# Patient Record
Sex: Male | Born: 1985 | Race: Black or African American | Hispanic: No | Marital: Single | Smoking: Former smoker
Health system: Southern US, Community
[De-identification: ages and names within clinical notes are randomized; demographics above are authoritative.]

## PROBLEM LIST (undated history)

## (undated) DIAGNOSIS — K279 Peptic ulcer, site unspecified, unspecified as acute or chronic, without hemorrhage or perforation: Secondary | ICD-10-CM

## (undated) DIAGNOSIS — R109 Unspecified abdominal pain: Secondary | ICD-10-CM

---
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
## 2004-02-11 ENCOUNTER — Emergency Department (HOSPITAL_COMMUNITY): Admission: EM | Admit: 2004-02-11 | Discharge: 2004-02-11 | Payer: Self-pay | Admitting: Emergency Medicine

## 2006-05-19 ENCOUNTER — Emergency Department (HOSPITAL_COMMUNITY): Admission: EM | Admit: 2006-05-19 | Discharge: 2006-05-19 | Payer: Self-pay | Admitting: Emergency Medicine

## 2007-04-19 ENCOUNTER — Emergency Department (HOSPITAL_COMMUNITY): Admission: EM | Admit: 2007-04-19 | Discharge: 2007-04-19 | Payer: Self-pay | Admitting: Family Medicine

## 2007-08-10 ENCOUNTER — Emergency Department (HOSPITAL_COMMUNITY): Admission: EM | Admit: 2007-08-10 | Discharge: 2007-08-10 | Payer: Self-pay | Admitting: Emergency Medicine

## 2007-10-02 ENCOUNTER — Emergency Department (HOSPITAL_COMMUNITY): Admission: EM | Admit: 2007-10-02 | Discharge: 2007-10-02 | Payer: Self-pay | Admitting: Emergency Medicine

## 2008-07-16 ENCOUNTER — Emergency Department (HOSPITAL_COMMUNITY): Admission: EM | Admit: 2008-07-16 | Discharge: 2008-07-16 | Payer: Self-pay | Admitting: Emergency Medicine

## 2008-07-23 ENCOUNTER — Emergency Department (HOSPITAL_COMMUNITY): Admission: EM | Admit: 2008-07-23 | Discharge: 2008-07-23 | Payer: Self-pay | Admitting: Emergency Medicine

## 2008-11-17 ENCOUNTER — Emergency Department (HOSPITAL_COMMUNITY): Admission: EM | Admit: 2008-11-17 | Discharge: 2008-11-17 | Payer: Self-pay | Admitting: Emergency Medicine

## 2008-11-20 ENCOUNTER — Inpatient Hospital Stay (HOSPITAL_COMMUNITY): Admission: EM | Admit: 2008-11-20 | Discharge: 2008-11-23 | Payer: Self-pay | Admitting: Emergency Medicine

## 2008-12-24 ENCOUNTER — Emergency Department (HOSPITAL_COMMUNITY): Admission: EM | Admit: 2008-12-24 | Discharge: 2008-12-24 | Payer: Self-pay | Admitting: Emergency Medicine

## 2009-03-12 ENCOUNTER — Emergency Department (HOSPITAL_COMMUNITY): Admission: EM | Admit: 2009-03-12 | Discharge: 2009-03-12 | Payer: Self-pay | Admitting: Emergency Medicine

## 2009-05-26 ENCOUNTER — Emergency Department (HOSPITAL_COMMUNITY): Admission: EM | Admit: 2009-05-26 | Discharge: 2009-05-27 | Payer: Self-pay | Admitting: Emergency Medicine

## 2009-11-25 ENCOUNTER — Emergency Department (HOSPITAL_COMMUNITY): Admission: EM | Admit: 2009-11-25 | Discharge: 2009-11-25 | Payer: Self-pay | Admitting: Emergency Medicine

## 2010-05-21 LAB — RAPID STREP SCREEN (MED CTR MEBANE ONLY): Streptococcus, Group A Screen (Direct): NEGATIVE

## 2010-06-12 LAB — BASIC METABOLIC PANEL
BUN: 5 mg/dL — ABNORMAL LOW (ref 6–23)
BUN: 6 mg/dL (ref 6–23)
Calcium: 8.5 mg/dL (ref 8.4–10.5)
Chloride: 103 mEq/L (ref 96–112)
GFR calc Af Amer: >60 mL/min (ref >60)
Potassium: 3.7 mEq/L (ref 3.5–5.1)
Sodium: 138 mEq/L (ref 135–145)
Sodium: 138 mEq/L (ref 135–145)

## 2010-06-12 LAB — DIFFERENTIAL
Basophils Absolute: 0.4 10*3/uL — ABNORMAL HIGH (ref 0.0–0.1)
Basophils Relative: 2 % — ABNORMAL HIGH (ref 0–1)
Eosinophils Absolute: 0.1 10*3/uL (ref 0.0–0.7)
Eosinophils Relative: 0 % (ref 0–5)
Lymphocytes Relative: 4 % — ABNORMAL LOW (ref 12–46)
Lymphs Abs: 0.5 10*3/uL — ABNORMAL LOW (ref 0.7–4.0)

## 2010-06-12 LAB — CBC
HCT: 38.3 % — ABNORMAL LOW (ref 39.0–52.0)
HCT: 43.8 % (ref 39.0–52.0)
Hemoglobin: 12.7 g/dL — ABNORMAL LOW (ref 13.0–17.0)
MCHC: 33 g/dL (ref 30.0–36.0)
MCV: 94.8 fL (ref 78.0–100.0)
Platelets: 265 10*3/uL (ref 150–400)
RBC: 4.04 MIL/uL — ABNORMAL LOW (ref 4.22–5.81)
RBC: 4.68 MIL/uL (ref 4.22–5.81)
RDW: 13.2 % (ref 11.5–15.5)
RDW: 13.5 % (ref 11.5–15.5)
WBC: 14.5 10*3/uL — ABNORMAL HIGH (ref 4.0–10.5)
WBC: 15.7 10*3/uL — ABNORMAL HIGH (ref 4.0–10.5)

## 2010-06-12 LAB — CULTURE, BLOOD (ROUTINE X 2)

## 2010-06-12 LAB — STREP A DNA PROBE

## 2010-06-16 LAB — DIFFERENTIAL
Eosinophils Relative: 1 % (ref 0–5)
Lymphs Abs: 0.5 10*3/uL — ABNORMAL LOW (ref 0.7–4.0)
Neutrophils Relative %: 88 % — ABNORMAL HIGH (ref 43–77)

## 2010-06-16 LAB — COMPREHENSIVE METABOLIC PANEL
ALT: 25 U/L (ref 0–53)
AST: 25 U/L (ref 0–37)
Alkaline Phosphatase: 75 U/L (ref 39–117)
GFR calc Af Amer: >60 mL/min (ref >60)
GFR calc non Af Amer: >60 mL/min (ref >60)
Potassium: 4 mEq/L (ref 3.5–5.1)
Sodium: 137 mEq/L (ref 135–145)
Total Bilirubin: 0.9 mg/dL (ref 0.3–1.2)

## 2010-06-16 LAB — CBC
HCT: 44.9 % (ref 39.0–52.0)
Hemoglobin: 15.3 g/dL (ref 13.0–17.0)
MCHC: 34 g/dL (ref 30.0–36.0)
Platelets: 284 10*3/uL (ref 150–400)
RDW: 12.9 % (ref 11.5–15.5)

## 2010-10-14 ENCOUNTER — Emergency Department (HOSPITAL_COMMUNITY)
Admission: EM | Admit: 2010-10-14 | Discharge: 2010-10-14 | Disposition: A | Discharge status: Home / Self Care | Payer: BC Managed Care – PPO | Attending: Emergency Medicine | Admitting: Emergency Medicine

## 2010-10-14 DIAGNOSIS — L299 Pruritus, unspecified: Secondary | ICD-10-CM | POA: Insufficient documentation

## 2010-10-14 DIAGNOSIS — B86 Scabies: Secondary | ICD-10-CM | POA: Insufficient documentation

## 2010-11-27 LAB — INFLUENZA A AND B ANTIGEN (CONVERTED LAB)
Inflenza A Ag: POSITIVE — AB
Influenza B Ag: NEGATIVE

## 2010-12-03 LAB — POCT I-STAT, CHEM 8
Glucose, Bld: 87
HCT: 49
Hemoglobin: 16.7
Potassium: 4
TCO2: 29

## 2010-12-03 LAB — DIFFERENTIAL
Basophils Relative: 1
Eosinophils Absolute: 0.3
Monocytes Relative: 8
Neutrophils Relative %: 66

## 2010-12-03 LAB — CBC
MCHC: 33.1
MCV: 93

## 2010-12-03 LAB — URINALYSIS, ROUTINE W REFLEX MICROSCOPIC
Bilirubin Urine: NEGATIVE
Protein, ur: NEGATIVE
Specific Gravity, Urine: 1.02
pH: 6.5

## 2010-12-03 LAB — URINE MICROSCOPIC-ADD ON

## 2010-12-03 LAB — URINE CULTURE
Colony Count: NO GROWTH
Culture: NO GROWTH

## 2012-04-08 ENCOUNTER — Emergency Department (HOSPITAL_COMMUNITY): Admission: EM | Admit: 2012-04-08 | Discharge: 2012-04-08 | Discharge status: Absent without leave | Payer: Self-pay

## 2012-08-20 ENCOUNTER — Other Ambulatory Visit (INDEPENDENT_AMBULATORY_CARE_PROVIDER_SITE_OTHER): Payer: Self-pay | Admitting: Surgery

## 2012-08-20 ENCOUNTER — Observation Stay (HOSPITAL_COMMUNITY)
Admission: EM | Admit: 2012-08-20 | Discharge: 2012-08-23 | Disposition: A | Discharge status: Home / Self Care | Payer: BC Managed Care – PPO | Attending: General Surgery | Admitting: General Surgery

## 2012-08-20 ENCOUNTER — Emergency Department (HOSPITAL_COMMUNITY): Payer: BC Managed Care – PPO

## 2012-08-20 ENCOUNTER — Observation Stay (HOSPITAL_COMMUNITY): Payer: BC Managed Care – PPO

## 2012-08-20 ENCOUNTER — Encounter (HOSPITAL_COMMUNITY)
Admission: EM | Disposition: A | Discharge status: Home / Self Care | Payer: Self-pay | Source: Home / Self Care | Attending: Emergency Medicine

## 2012-08-20 ENCOUNTER — Encounter (HOSPITAL_COMMUNITY): Payer: Self-pay | Admitting: Emergency Medicine

## 2012-08-20 ENCOUNTER — Emergency Department (HOSPITAL_COMMUNITY): Payer: BC Managed Care – PPO | Admitting: Anesthesiology

## 2012-08-20 ENCOUNTER — Encounter (HOSPITAL_COMMUNITY): Payer: Self-pay | Admitting: Anesthesiology

## 2012-08-20 DIAGNOSIS — K810 Acute cholecystitis: Secondary | ICD-10-CM

## 2012-08-20 DIAGNOSIS — K8010 Calculus of gallbladder with chronic cholecystitis without obstruction: Secondary | ICD-10-CM

## 2012-08-20 DIAGNOSIS — Z9049 Acquired absence of other specified parts of digestive tract: Secondary | ICD-10-CM

## 2012-08-20 DIAGNOSIS — K8000 Calculus of gallbladder with acute cholecystitis without obstruction: Principal | ICD-10-CM | POA: Insufficient documentation

## 2012-08-20 HISTORY — DX: Peptic ulcer, site unspecified, unspecified as acute or chronic, without hemorrhage or perforation: K27.9

## 2012-08-20 HISTORY — PX: CHOLECYSTECTOMY: SHX55

## 2012-08-20 HISTORY — DX: Unspecified abdominal pain: R10.9

## 2012-08-20 LAB — CBC WITH DIFFERENTIAL/PLATELET
Basophils Absolute: 0 10*3/uL (ref 0.0–0.1)
Basophils Relative: 0 % (ref 0–1)
Eosinophils Absolute: 0.2 K/uL (ref 0.0–0.7)
Eosinophils Relative: 2 % (ref 0–5)
HCT: 41.8 % (ref 39.0–52.0)
Hemoglobin: 14.3 g/dL (ref 13.0–17.0)
Lymphocytes Relative: 15 % (ref 12–46)
Lymphs Abs: 2 K/uL (ref 0.7–4.0)
MCH: 31 pg (ref 26.0–34.0)
MCHC: 34.2 g/dL (ref 30.0–36.0)
MCV: 90.5 fL (ref 78.0–100.0)
Monocytes Absolute: 0.7 K/uL (ref 0.1–1.0)
Monocytes Relative: 6 % (ref 3–12)
Neutro Abs: 10 10*3/uL — ABNORMAL HIGH (ref 1.7–7.7)
Neutrophils Relative %: 77 % (ref 43–77)
Platelets: 297 10*3/uL (ref 150–400)
RBC: 4.62 MIL/uL (ref 4.22–5.81)
RDW: 12.5 % (ref 11.5–15.5)
WBC: 12.9 K/uL — ABNORMAL HIGH (ref 4.0–10.5)

## 2012-08-20 LAB — URINALYSIS, ROUTINE W REFLEX MICROSCOPIC
Bilirubin Urine: NEGATIVE
Glucose, UA: NEGATIVE mg/dL
Hgb urine dipstick: NEGATIVE
Ketones, ur: NEGATIVE mg/dL
Leukocytes, UA: NEGATIVE
Nitrite: NEGATIVE
Protein, ur: NEGATIVE mg/dL
Specific Gravity, Urine: 1.035 — ABNORMAL HIGH (ref 1.005–1.030)
Urobilinogen, UA: 0.2 mg/dL (ref 0.0–1.0)
pH: 6 (ref 5.0–8.0)

## 2012-08-20 LAB — COMPREHENSIVE METABOLIC PANEL WITH GFR
ALT: 26 U/L (ref 0–53)
BUN: 9 mg/dL (ref 6–23)
CO2: 27 meq/L (ref 19–32)
Calcium: 9.2 mg/dL (ref 8.4–10.5)
Creatinine, Ser: 0.77 mg/dL (ref 0.50–1.35)
GFR calc Af Amer: >90 mL/min (ref >90)
GFR calc non Af Amer: >90 mL/min (ref >90)
Glucose, Bld: 121 mg/dL — ABNORMAL HIGH (ref 70–99)
Sodium: 136 meq/L (ref 135–145)

## 2012-08-20 LAB — CG4 I-STAT (LACTIC ACID): Lactic Acid, Venous: 1.37 mmol/L (ref 0.5–2.2)

## 2012-08-20 LAB — COMPREHENSIVE METABOLIC PANEL
AST: 20 U/L (ref 0–37)
Albumin: 3.7 g/dL (ref 3.5–5.2)
Alkaline Phosphatase: 64 U/L (ref 39–117)
Chloride: 100 mEq/L (ref 96–112)
Potassium: 3.9 mEq/L (ref 3.5–5.1)
Total Bilirubin: 0.3 mg/dL (ref 0.3–1.2)
Total Protein: 7.5 g/dL (ref 6.0–8.3)

## 2012-08-20 LAB — POCT I-STAT, CHEM 8
HCT: 47 % (ref 39.0–52.0)
Hemoglobin: 16 g/dL (ref 13.0–17.0)
Potassium: 3.8 mEq/L (ref 3.5–5.1)
Sodium: 140 mEq/L (ref 135–145)
TCO2: 28 mmol/L (ref 0–100)

## 2012-08-20 LAB — LIPASE, BLOOD: Lipase: 34 U/L (ref 11–59)

## 2012-08-20 SURGERY — LAPAROSCOPIC CHOLECYSTECTOMY
Anesthesia: General | Wound class: Clean Contaminated

## 2012-08-20 MED ORDER — BUPIVACAINE-EPINEPHRINE 0.25% -1:200000 IJ SOLN
INTRAMUSCULAR | Status: DC | PRN
  Administered 2012-08-20: 20 mL

## 2012-08-20 MED ORDER — HYDROMORPHONE HCL PF 1 MG/ML IJ SOLN
INTRAMUSCULAR | Status: AC
  Filled 2012-08-20: qty 1

## 2012-08-20 MED ORDER — ATROPINE SULFATE 0.4 MG/ML IJ SOLN
INTRAMUSCULAR | Status: DC | PRN
  Administered 2012-08-20: 0.4 mg via INTRAVENOUS

## 2012-08-20 MED ORDER — ROCURONIUM BROMIDE 100 MG/10ML IV SOLN
INTRAVENOUS | Status: DC | PRN
  Administered 2012-08-20: 10 mg via INTRAVENOUS
  Administered 2012-08-20: 30 mg via INTRAVENOUS

## 2012-08-20 MED ORDER — MORPHINE SULFATE 2 MG/ML IJ SOLN
1.0000 mg | INTRAMUSCULAR | Status: DC | PRN
  Administered 2012-08-20 (×2): 1 mg via INTRAVENOUS
  Filled 2012-08-20 (×2): qty 1

## 2012-08-20 MED ORDER — PANTOPRAZOLE SODIUM 40 MG IV SOLR
40.0000 mg | Freq: Once | INTRAVENOUS | Status: AC
  Administered 2012-08-20: 40 mg via INTRAVENOUS
  Filled 2012-08-20: qty 40

## 2012-08-20 MED ORDER — GLYCOPYRROLATE 0.2 MG/ML IJ SOLN
INTRAMUSCULAR | Status: DC | PRN
  Administered 2012-08-20: .6 mg via INTRAVENOUS

## 2012-08-20 MED ORDER — ONDANSETRON HCL 4 MG/2ML IJ SOLN
INTRAMUSCULAR | Status: AC
  Filled 2012-08-20: qty 2

## 2012-08-20 MED ORDER — ONDANSETRON HCL 4 MG/2ML IJ SOLN
4.0000 mg | Freq: Once | INTRAMUSCULAR | Status: AC
  Administered 2012-08-20: 4 mg via INTRAVENOUS
  Filled 2012-08-20: qty 2

## 2012-08-20 MED ORDER — HEPARIN SODIUM (PORCINE) 5000 UNIT/ML IJ SOLN
5000.0000 [IU] | Freq: Three times a day (TID) | INTRAMUSCULAR | Status: DC
  Administered 2012-08-20 – 2012-08-23 (×9): 5000 [IU] via SUBCUTANEOUS
  Filled 2012-08-20 (×12): qty 1

## 2012-08-20 MED ORDER — DEXAMETHASONE SODIUM PHOSPHATE 10 MG/ML IJ SOLN
INTRAMUSCULAR | Status: DC | PRN
  Administered 2012-08-20: 10 mg via INTRAVENOUS

## 2012-08-20 MED ORDER — ONDANSETRON HCL 4 MG/2ML IJ SOLN
4.0000 mg | Freq: Four times a day (QID) | INTRAMUSCULAR | Status: DC | PRN
  Administered 2012-08-20: 4 mg via INTRAVENOUS

## 2012-08-20 MED ORDER — SODIUM CHLORIDE 0.9 % IV SOLN
3.0000 g | Freq: Four times a day (QID) | INTRAVENOUS | Status: DC
  Administered 2012-08-20: 3 g via INTRAVENOUS
  Filled 2012-08-20 (×3): qty 3

## 2012-08-20 MED ORDER — 0.9 % SODIUM CHLORIDE (POUR BTL) OPTIME
TOPICAL | Status: DC | PRN
  Administered 2012-08-20: 1000 mL

## 2012-08-20 MED ORDER — PROPOFOL 10 MG/ML IV BOLUS
INTRAVENOUS | Status: DC | PRN
  Administered 2012-08-20: 200 mg via INTRAVENOUS

## 2012-08-20 MED ORDER — HYDROMORPHONE HCL PF 1 MG/ML IJ SOLN
INTRAMUSCULAR | Status: DC | PRN
  Administered 2012-08-20: 1 mg via INTRAVENOUS

## 2012-08-20 MED ORDER — IOHEXOL 300 MG/ML  SOLN
100.0000 mL | Freq: Once | INTRAMUSCULAR | Status: AC | PRN
  Administered 2012-08-20: 100 mL via INTRAVENOUS

## 2012-08-20 MED ORDER — LIDOCAINE HCL (CARDIAC) 20 MG/ML IV SOLN
INTRAVENOUS | Status: DC | PRN
  Administered 2012-08-20: 100 mg via INTRAVENOUS

## 2012-08-20 MED ORDER — HYDROMORPHONE HCL PF 1 MG/ML IJ SOLN
0.5000 mg | Freq: Once | INTRAMUSCULAR | Status: AC
  Administered 2012-08-20: 0.5 mg via INTRAVENOUS
  Filled 2012-08-20: qty 1

## 2012-08-20 MED ORDER — IOHEXOL 300 MG/ML  SOLN
50.0000 mL | Freq: Once | INTRAMUSCULAR | Status: AC | PRN
  Administered 2012-08-20: 50 mL via ORAL

## 2012-08-20 MED ORDER — ONDANSETRON HCL 4 MG/2ML IJ SOLN
4.0000 mg | Freq: Four times a day (QID) | INTRAMUSCULAR | Status: DC | PRN
  Administered 2012-08-20: 4 mg via INTRAVENOUS
  Filled 2012-08-20: qty 2

## 2012-08-20 MED ORDER — IOHEXOL 300 MG/ML  SOLN
INTRAMUSCULAR | Status: DC | PRN
  Administered 2012-08-20: 7 mL via INTRAVENOUS

## 2012-08-20 MED ORDER — KCL IN DEXTROSE-NACL 20-5-0.45 MEQ/L-%-% IV SOLN
INTRAVENOUS | Status: AC
  Administered 2012-08-20: 1000 mL
  Filled 2012-08-20: qty 1000

## 2012-08-20 MED ORDER — HEPARIN SODIUM (PORCINE) 5000 UNIT/ML IJ SOLN
5000.0000 [IU] | Freq: Three times a day (TID) | INTRAMUSCULAR | Status: DC
  Filled 2012-08-20 (×3): qty 1

## 2012-08-20 MED ORDER — HYDROMORPHONE HCL PF 1 MG/ML IJ SOLN: 0.2500 mg | INTRAMUSCULAR | Status: DC | PRN

## 2012-08-20 MED ORDER — BUPIVACAINE-EPINEPHRINE PF 0.25-1:200000 % IJ SOLN
INTRAMUSCULAR | Status: AC
  Filled 2012-08-20: qty 30

## 2012-08-20 MED ORDER — NALOXONE HCL 0.4 MG/ML IJ SOLN
INTRAMUSCULAR | Status: DC | PRN
  Administered 2012-08-20 (×5): .04 ug via INTRAVENOUS

## 2012-08-20 MED ORDER — LACTATED RINGERS IR SOLN
Status: DC | PRN
  Administered 2012-08-20: 1000 mL

## 2012-08-20 MED ORDER — HYDROMORPHONE HCL PF 1 MG/ML IJ SOLN
1.0000 mg | Freq: Once | INTRAMUSCULAR | Status: AC
  Administered 2012-08-20: 1 mg via INTRAVENOUS
  Filled 2012-08-20: qty 1

## 2012-08-20 MED ORDER — IOHEXOL 300 MG/ML  SOLN
INTRAMUSCULAR | Status: AC
  Filled 2012-08-20: qty 1

## 2012-08-20 MED ORDER — SUCCINYLCHOLINE CHLORIDE 20 MG/ML IJ SOLN
INTRAMUSCULAR | Status: DC | PRN
  Administered 2012-08-20: 100 mg via INTRAVENOUS

## 2012-08-20 MED ORDER — HYDROCODONE-ACETAMINOPHEN 5-325 MG PO TABS
1.0000 | ORAL_TABLET | ORAL | Status: DC | PRN
  Administered 2012-08-21 – 2012-08-23 (×5): 2 via ORAL
  Filled 2012-08-20 (×5): qty 2

## 2012-08-20 MED ORDER — ONDANSETRON HCL 4 MG/2ML IJ SOLN
INTRAMUSCULAR | Status: DC | PRN
  Administered 2012-08-20: 4 mg via INTRAVENOUS

## 2012-08-20 MED ORDER — PROMETHAZINE HCL 25 MG/ML IJ SOLN: 6.2500 mg | INTRAMUSCULAR | Status: DC | PRN

## 2012-08-20 MED ORDER — MORPHINE SULFATE 2 MG/ML IJ SOLN
2.0000 mg | INTRAMUSCULAR | Status: DC | PRN
  Administered 2012-08-20 – 2012-08-22 (×5): 2 mg via INTRAVENOUS
  Filled 2012-08-20 (×5): qty 1

## 2012-08-20 MED ORDER — ACETAMINOPHEN 325 MG PO TABS: 650.0000 mg | ORAL_TABLET | ORAL | Status: DC | PRN

## 2012-08-20 MED ORDER — NEOSTIGMINE METHYLSULFATE 1 MG/ML IJ SOLN
INTRAMUSCULAR | Status: DC | PRN
  Administered 2012-08-20: 5 mg via INTRAVENOUS

## 2012-08-20 MED ORDER — HYDROMORPHONE HCL PF 1 MG/ML IJ SOLN
1.0000 mg | INTRAMUSCULAR | Status: DC | PRN
  Administered 2012-08-20: 1 mg via INTRAVENOUS
  Filled 2012-08-20: qty 1

## 2012-08-20 MED ORDER — FENTANYL CITRATE 0.05 MG/ML IJ SOLN
INTRAMUSCULAR | Status: DC | PRN
  Administered 2012-08-20: 100 ug via INTRAVENOUS
  Administered 2012-08-20: 50 ug via INTRAVENOUS
  Administered 2012-08-20: 100 ug via INTRAVENOUS

## 2012-08-20 MED ORDER — KCL IN DEXTROSE-NACL 20-5-0.45 MEQ/L-%-% IV SOLN
INTRAVENOUS | Status: DC
  Administered 2012-08-21 (×3): via INTRAVENOUS
  Administered 2012-08-22: 100 mL via INTRAVENOUS
  Administered 2012-08-22: 15:00:00 via INTRAVENOUS
  Filled 2012-08-20 (×9): qty 1000

## 2012-08-20 MED ORDER — SODIUM CHLORIDE 0.9 % IV SOLN: 3.0000 g | Freq: Four times a day (QID) | INTRAVENOUS | Status: DC

## 2012-08-20 MED ORDER — SODIUM CHLORIDE 0.9 % IV SOLN
Freq: Once | INTRAVENOUS | Status: AC
  Administered 2012-08-20: 100 mL/h via INTRAVENOUS

## 2012-08-20 MED ORDER — LACTATED RINGERS IV SOLN
INTRAVENOUS | Status: DC | PRN
  Administered 2012-08-20 (×2): via INTRAVENOUS

## 2012-08-20 SURGICAL SUPPLY — 45 items
ADH SKN CLS APL DERMABOND .7 (GAUZE/BANDAGES/DRESSINGS) ×1
APL SKNCLS STERI-STRIP NONHPOA (GAUZE/BANDAGES/DRESSINGS)
APPLIER CLIP 5 13 M/L LIGAMAX5 (MISCELLANEOUS)
APPLIER CLIP ROT 10 11.4 M/L (STAPLE)
APR CLP MED LRG 11.4X10 (STAPLE)
APR CLP MED LRG 5 ANG JAW (MISCELLANEOUS)
BAG SPEC RTRVL LRG 6X4 10 (ENDOMECHANICALS) ×1
BENZOIN TINCTURE PRP APPL 2/3 (GAUZE/BANDAGES/DRESSINGS) ×1 IMPLANT
CABLE HIGH FREQUENCY MONO STRZ (ELECTRODE) IMPLANT
CANISTER SUCTION 2500CC (MISCELLANEOUS) ×2 IMPLANT
CATH REDDICK CHOLANGI 4FR 50CM (CATHETERS) IMPLANT
CLIP APPLIE 5 13 M/L LIGAMAX5 (MISCELLANEOUS) IMPLANT
CLIP APPLIE ROT 10 11.4 M/L (STAPLE) IMPLANT
CLOTH BEACON ORANGE TIMEOUT ST (SAFETY) ×2 IMPLANT
COVER MAYO STAND STRL (DRAPES) ×2 IMPLANT
COVER SURGICAL LIGHT HANDLE (MISCELLANEOUS) ×1 IMPLANT
DECANTER SPIKE VIAL GLASS SM (MISCELLANEOUS) ×1 IMPLANT
DERMABOND ADVANCED (GAUZE/BANDAGES/DRESSINGS) ×1
DERMABOND ADVANCED .7 DNX12 (GAUZE/BANDAGES/DRESSINGS) IMPLANT
DRAPE C-ARM 42X120 X-RAY (DRAPES) ×2 IMPLANT
DRAPE LAPAROSCOPIC ABDOMINAL (DRAPES) ×2 IMPLANT
ELECT REM PT RETURN 9FT ADLT (ELECTROSURGICAL) ×2
ELECTRODE REM PT RTRN 9FT ADLT (ELECTROSURGICAL) ×1 IMPLANT
GLOVE BIOGEL M 8.0 STRL (GLOVE) ×2 IMPLANT
GOWN STRL NON-REIN LRG LVL3 (GOWN DISPOSABLE) ×2 IMPLANT
GOWN STRL REIN XL XLG (GOWN DISPOSABLE) ×4 IMPLANT
HEMOSTAT SURGICEL 4X8 (HEMOSTASIS) IMPLANT
IV CATH 14GX2 1/4 (CATHETERS) ×2 IMPLANT
KIT BASIN OR (CUSTOM PROCEDURE TRAY) ×2 IMPLANT
NS IRRIG 1000ML POUR BTL (IV SOLUTION) ×2 IMPLANT
POUCH SPECIMEN RETRIEVAL 10MM (ENDOMECHANICALS) ×2 IMPLANT
SET CHOLANGIOGRAPH MIX (MISCELLANEOUS) IMPLANT
SET IRRIG TUBING LAPAROSCOPIC (IRRIGATION / IRRIGATOR) ×2 IMPLANT
SLEEVE Z-THREAD 5X100MM (TROCAR) ×2 IMPLANT
SOLUTION ANTI FOG 6CC (MISCELLANEOUS) ×2 IMPLANT
STRIP CLOSURE SKIN 1/2X4 (GAUZE/BANDAGES/DRESSINGS) ×1 IMPLANT
SUT VIC AB 4-0 SH 18 (SUTURE) ×2 IMPLANT
SYR 30ML LL (SYRINGE) ×2 IMPLANT
TRAY LAP CHOLE (CUSTOM PROCEDURE TRAY) ×2 IMPLANT
TROCAR BLADELESS OPT 5 75 (ENDOMECHANICALS) IMPLANT
TROCAR XCEL BLUNT TIP 100MML (ENDOMECHANICALS) ×1 IMPLANT
TROCAR XCEL NON-BLD 11X100MML (ENDOMECHANICALS) IMPLANT
TROCAR Z-THREAD FIOS 11X100 BL (TROCAR) IMPLANT
TROCAR Z-THREAD FIOS 5X100MM (TROCAR) ×1 IMPLANT
TUBING INSUFFLATION 10FT LAP (TUBING) ×2 IMPLANT

## 2012-08-20 NOTE — Progress Notes (Signed)
pacu nursing:  Pt states he does know a person by the name of [employer] and it is okay for her to pick up his belongings.  He states he DOES NOT know of a person by the name of Rodney Booze who called the PACU requesting information.  No information was released.  Rodney Booze would not leave a call back number.

## 2012-08-20 NOTE — Transfer of Care (Signed)
Immediate Anesthesia Transfer of Care Note  Patient: Joshua Barron  Procedure(s) Performed: Procedure(s): LAPAROSCOPIC CHOLECYSTECTOMY WITH INTRAOPERATIVE CHOLANGIOGRAM (N/A)  Patient Location: PACU  Anesthesia Type:General  Level of Consciousness: awake and sedated  Airway & Oxygen Therapy: Patient Spontanous Breathing and Patient connected to face mask oxygen  Post-op Assessment: Report given to PACU RN and Post -op Vital signs reviewed and stable  Post vital signs: Reviewed and stable  Complications: No apparent anesthesia complications

## 2012-08-20 NOTE — Anesthesia Preprocedure Evaluation (Signed)
Anesthesia Evaluation  Patient identified by MRN, date of birth, ID band Patient awake    Reviewed: Allergy & Precautions, H&P , NPO status , Patient's Chart, lab work & pertinent test results  Airway Mallampati: II TM Distance: >3 FB Neck ROM: Full    Dental no notable dental hx.    Pulmonary neg pulmonary ROS,  breath sounds clear to auscultation  Pulmonary exam normal       Cardiovascular Exercise Tolerance: Good negative cardio ROS  Rhythm:Regular Rate:Normal     Neuro/Psych negative neurological ROS  negative psych ROS   GI/Hepatic Neg liver ROS, PUD,   Endo/Other  negative endocrine ROS  Renal/GU negative Renal ROS  negative genitourinary   Musculoskeletal negative musculoskeletal ROS (+)   Abdominal   Peds negative pediatric ROS (+)  Hematology negative hematology ROS (+)   Anesthesia Other Findings   Reproductive/Obstetrics negative OB ROS                           Anesthesia Physical Anesthesia Plan  ASA: II and emergent  Anesthesia Plan: General   Post-op Pain Management:    Induction: Intravenous  Airway Management Planned: Oral ETT  Additional Equipment:   Intra-op Plan:   Post-operative Plan: Extubation in OR  Informed Consent: I have reviewed the patients History and Physical, chart, labs and discussed the procedure including the risks, benefits and alternatives for the proposed anesthesia with the patient or authorized representative who has indicated his/her understanding and acceptance.   Dental advisory given  Plan Discussed with: CRNA  Anesthesia Plan Comments:         Anesthesia Quick Evaluation

## 2012-08-20 NOTE — ED Notes (Signed)
Patient transported to CT 

## 2012-08-20 NOTE — ED Notes (Signed)
Bed:WA11<BR> Expected date:<BR> Expected time:<BR> Means of arrival:<BR> Comments:<BR>

## 2012-08-20 NOTE — Op Note (Signed)
Joshua Barron @date @  Procedure: Laparoscopic Cholecystectomy with intraoperative cholangiogram  Surgeon: Wenda Low, MD, FACS Asst:  Ovidio Kin, MD, FACS  Anes:  General  Drains: None  Findings: Acute cholecystitis with stones impacted in neck of gallbladder  Description of Procedure: The patient was taken to OR 1 and given general anesthesia.  The patient was prepped with PCMX and draped sterilely. A time out was performed.  Access to the abdomen was achieved with [facility] port in the umbilicus.  Port placement included 2  Five mm and 2  11 mm.  .    The gallbladder was visualized and the fundus was grasped and the gallbladder was elevated. Traction on the infundibulum allowed for successful demonstration of the critical view. Inflammatory changes were acute and chronic adhesions to the duodenum were taken down indicating to me that his prior "PUD" was likely cholecystitis.  The cystic duct was identified and clipped up on the gallbladder and an incision was made in the cystic duct and the Reddick catheter was inserted after milking the cystic duct of any debris. A dynamic cholangiogram was performed which demonstrated intrahepatic filling and flow into the duodenum.  No stones were seen.    The cystic duct was then triple clipped and divided, the cystic artery was double clipped and divided and then the gallbladder was removed from the gallbladder bed. Removal of the gallbladder from the gallbladder bed was without complications.  The gallbladder was then placed in a bag and brought out through one of the 10 mm trocar sites. The gallbladder bed was inspected and no bleeding or bile leaks were seen.   Laparoscopic visualization was used when closing fascial defects for trocar sites.   Incisions were injected with marcaine and closed with 4-0 Vicryl and Dermabond on the skin.  Sponge and needle count were correct.    The patient was taken to the recovery room in satisfactory condition.

## 2012-08-20 NOTE — Preoperative (Signed)
Beta Blockers   Reason not to administer Beta Blockers:Not Applicable 

## 2012-08-20 NOTE — H&P (Signed)
Chief Complaint:  RUQ abdominal pain and abnormal CT scan  History of Present Illness:  Joshua Barron is an 27 y.o. male who ate pizza last night and developed sudden onset of severe epigastric pain.  CT scan and workup by Dr. Oletta Lamas in the [facility] ER revealed an inflamed gallbladder.  He remained tender after pain meds and will be taken from lap chole later today.  The procedure and alternatives and risks and benefits were discussed with him in the presence of his mother.  They seem to understand the indications and risks not limited to bleeding, bowel injury, bile leaks and bile duct injuries.    Past Medical History  Diagnosis Date  . Peptic ulcer disease     History reviewed. No pertinent past surgical history.  Current Facility-Administered Medications  Medication Dose Route Frequency Provider Last Rate Last Dose  . Ampicillin-Sulbactam (UNASYN) 3 g in sodium chloride 0.9 % 100 mL IVPB  3 g Intravenous Q6H Loma Messing [place], [facility]      . HYDROmorphone (DILAUDID) injection 1 mg  1 mg Intravenous Q3H PRN Gavin Pound. Ghim, MD   1 mg at 08/20/12 0720  . ondansetron (ZOFRAN) injection 4 mg  4 mg Intravenous Q6H PRN Gavin Pound. Ghim, MD   4 mg at 08/20/12 1610   Current Outpatient Prescriptions  Medication Sig Dispense Refill  . ranitidine (ZANTAC) 75 MG tablet Take 75 mg by mouth 2 (two) times daily as needed for heartburn.       Review of patient's allergies indicates no known allergies. History reviewed. No pertinent family history. Social History:   reports that he has never smoked. He does not have any smokeless tobacco history on file. His alcohol and drug histories are not on file.   REVIEW OF SYSTEMS - PERTINENT POSITIVES ONLY: No prior surgery  Physical Exam:   Blood pressure 104/61, pulse 69, temperature 97.8 F (36.6 C), temperature source Oral, resp. rate 16, SpO2 99.00%. There is no height or weight on file to calculate BMI.  Gen:  WDWN AAM NAD  Neurological: Alert and  oriented to person, place, and time. Motor and sensory function is grossly intact  Head: Normocephalic and atraumatic.  Eyes: Conjunctivae are normal. Pupils are equal, round, and reactive to light. No scleral icterus.  Neck: Normal range of motion. Neck supple. No tracheal deviation or thyromegaly present.  Cardiovascular:  SR without murmurs or gallops.  No carotid bruits Respiratory: Effort normal.  No respiratory distress. No chest wall tenderness. Breath sounds normal.  No wheezes, rales or rhonchi.  Abdomen:  Tender in the right upper quadrant GU: Musculoskeletal: Normal range of motion. Extremities are nontender. No cyanosis, edema or clubbing noted Lymphadenopathy: No cervical, preauricular, postauricular or axillary adenopathy is present Skin: Skin is warm and dry. No rash noted. No diaphoresis. No erythema. No pallor. Pscyh: Normal mood and affect. Behavior is normal. Judgment and thought content normal.   LABORATORY RESULTS: Results for orders placed during the hospital encounter of 08/20/12 (from the past 48 hour(s))  CBC WITH DIFFERENTIAL     Status: Abnormal   Collection Time    08/20/12  4:40 AM      Result Value Range   WBC 12.9 (*) 4.0 - 10.5 K/uL   RBC 4.62  4.22 - 5.81 MIL/uL   Hemoglobin 14.3  13.0 - 17.0 g/dL   HCT 96.0  45.4 - 09.8 %   MCV 90.5  78.0 - 100.0 fL   MCH 31.0  26.0 -  34.0 pg   MCHC 34.2  30.0 - 36.0 g/dL   RDW 62.1  30.8 - 65.7 %   Platelets 297  150 - 400 K/uL   Neutrophils Relative % 77  43 - 77 %   Neutro Abs 10.0 (*) 1.7 - 7.7 K/uL   Lymphocytes Relative 15  12 - 46 %   Lymphs Abs 2.0  0.7 - 4.0 K/uL   Monocytes Relative 6  3 - 12 %   Monocytes Absolute 0.7  0.1 - 1.0 K/uL   Eosinophils Relative 2  0 - 5 %   Eosinophils Absolute 0.2  0.0 - 0.7 K/uL   Basophils Relative 0  0 - 1 %   Basophils Absolute 0.0  0.0 - 0.1 K/uL  LIPASE, BLOOD     Status: None   Collection Time    08/20/12  4:40 AM      Result Value Range   Lipase 34  11 - 59 U/L   COMPREHENSIVE METABOLIC PANEL     Status: Abnormal   Collection Time    08/20/12  4:40 AM      Result Value Range   Sodium 136  135 - 145 mEq/L   Potassium 3.9  3.5 - 5.1 mEq/L   Chloride 100  96 - 112 mEq/L   CO2 27  19 - 32 mEq/L   Glucose, Bld 121 (*) 70 - 99 mg/dL   BUN 9  6 - 23 mg/dL   Creatinine, Ser 8.46  0.50 - 1.35 mg/dL   Calcium 9.2  8.4 - 96.2 mg/dL   Total Protein 7.5  6.0 - 8.3 g/dL   Albumin 3.7  3.5 - 5.2 g/dL   AST 20  0 - 37 U/L   ALT 26  0 - 53 U/L   Alkaline Phosphatase 64  39 - 117 U/L   Total Bilirubin 0.3  0.3 - 1.2 mg/dL   GFR calc non Af Amer >90  >90 mL/min   GFR calc Af Amer >90  >90 mL/min   Comment:            The eGFR has been calculated     using the CKD EPI equation.     This calculation has not been     validated in all clinical     situations.     eGFR's persistently     <90 mL/min signify     possible Chronic Kidney Disease.  POCT I-STAT, CHEM 8     Status: Abnormal   Collection Time    08/20/12  4:47 AM      Result Value Range   Sodium 140  135 - 145 mEq/L   Potassium 3.8  3.5 - 5.1 mEq/L   Chloride 105  96 - 112 mEq/L   BUN 9  6 - 23 mg/dL   Creatinine, Ser 9.52  0.50 - 1.35 mg/dL   Glucose, Bld 841 (*) 70 - 99 mg/dL   Calcium, Ion 3.24 (*) 1.12 - 1.23 mmol/L   TCO2 28  0 - 100 mmol/L   Hemoglobin 16.0  13.0 - 17.0 g/dL   HCT 40.1  02.7 - 25.3 %  CG4 I-STAT (LACTIC ACID)     Status: None   Collection Time    08/20/12  5:37 AM      Result Value Range   Lactic Acid, Venous 1.37  0.5 - 2.2 mmol/L  URINALYSIS, ROUTINE W REFLEX MICROSCOPIC     Status: Abnormal   Collection Time  08/20/12  6:50 AM      Result Value Range   Color, Urine YELLOW  YELLOW   APPearance CLEAR  CLEAR   Specific Gravity, Urine 1.035 (*) 1.005 - 1.030   pH 6.0  5.0 - 8.0   Glucose, UA NEGATIVE  NEGATIVE mg/dL   Hgb urine dipstick NEGATIVE  NEGATIVE   Bilirubin Urine NEGATIVE  NEGATIVE   Ketones, ur NEGATIVE  NEGATIVE mg/dL   Protein, ur NEGATIVE   NEGATIVE mg/dL   Urobilinogen, UA 0.2  0.0 - 1.0 mg/dL   Nitrite NEGATIVE  NEGATIVE   Leukocytes, UA NEGATIVE  NEGATIVE   Comment: MICROSCOPIC NOT DONE ON URINES WITH NEGATIVE PROTEIN, BLOOD, LEUKOCYTES, NITRITE, OR GLUCOSE <1000 mg/dL.    RADIOLOGY RESULTS: Ct Abdomen Pelvis W Contrast  08/20/2012   *RADIOLOGY REPORT*  Clinical Data: Abdominal pain.  CT ABDOMEN AND PELVIS WITH CONTRAST  Technique:  Multidetector CT imaging of the abdomen and pelvis was performed following the standard protocol during bolus administration of intravenous contrast.  Contrast: 50mL OMNIPAQUE IOHEXOL 300 MG/ML  SOLN, OMNIPAQUE IOHEXOL 300 MG/ML  SOLN  Comparison: Abdominal series 08/10/2012  Findings: The lung bases are clear.  There is no evidence for pneumoperitoneum.  There appears to be diffuse wall thickening of the gallbladder with hyperemia of the gallbladder mucosa.  There is also hyperemia in the adjacent liver.  Findings are concerning for gallbladder wall inflammation.  There is no significant dilatation of the extrahepatic biliary system. No evidence for pancreatic duct dilatation.  However, there is an indeterminate 5 mm low density pancreatic structure near the pancreatic head.  This is best seen on the coronal reformats, image 33.  Normal appearance of the spleen, adrenal glands and both kidneys. There may be a trace amount of fluid in the pelvis.  No gross abnormality to the prostate, seminal vesicles or bladder. No acute bony abnormality.  IMPRESSION: Diffuse gallbladder wall thickening and hyperemia of the adjacent liver.  Findings are concerning for inflammation and acute cholecystitis. Trace amount of free fluid in the pelvis.  This gallbladder could be further evaluated with ultrasound to look for stones.  Indeterminate 5 mm low density structure near the pancreatic head. This small low density area could be further evaluated with a follow-up MRI of the abdomen in 1 year to ensure stability.    Original Report Authenticated By: Richarda Overlie, M.D.   Dg Abd Acute W/chest  08/20/2012   *RADIOLOGY REPORT*  Clinical Data: Abdominal pain, nausea.  ACUTE ABDOMEN SERIES (ABDOMEN 2 VIEW & CHEST 1 VIEW)  Comparison: 07/16/2008  Findings: Lungs clear.  No effusion.  Heart size upper limits normal.  No free air.  Normal bowel gas pattern.  Left pelvic phlebolith.  Regional bones unremarkable.  IMPRESSION:  1.  Normal bowel gas pattern. 2.  No free air. 3.  No acute cardiopulmonary disease.   Original Report Authenticated By: D. Andria Rhein, MD    Problem List: Patient Active Problem List   Diagnosis Date Noted  . Acute cholecystitis 08/20/2012    Assessment & Plan: Acute cholecystitis Laparoscopic cholecystectomy    Matt B. Daphine Deutscher, MD, [facility] [phone] beeper [phone]  08/20/2012 8:11 AM

## 2012-08-20 NOTE — Progress Notes (Signed)
ANTIBIOTIC CONSULT NOTE - INITIAL  Pharmacy Consult for Unasyn Indication:  Gallbladder  No Known Allergies  Patient Measurements:     Vital Signs: Temp: 97.8 F (36.6 C) (06/15 0713) Temp src: Oral (06/15 0713) BP: 104/61 mmHg (06/15 0713) Pulse Rate: 69 (06/15 0713) Intake/Output from previous day:   Intake/Output from this shift:    Labs:  Recent Labs  08/20/12 0440 08/20/12 0447  WBC 12.9*  --   HGB 14.3 16.0  PLT 297  --   CREATININE 0.77 0.80   CrCl is unknown because there is no height on file for the current visit. No results found for this basename: VANCOTROUGH, VANCOPEAK, VANCORANDOM, GENTTROUGH, GENTPEAK, GENTRANDOM, TOBRATROUGH, TOBRAPEAK, TOBRARND, AMIKACINPEAK, AMIKACINTROU, AMIKACIN,  in the last 72 hours   Microbiology: No results found for this or any previous visit (from the past 720 hour(s)).  Medical History: Past Medical History  Diagnosis Date  . Peptic ulcer disease     Medications:   (Not in a hospital admission) Scheduled:   Infusions:  . ampicillin-sulbactam (UNASYN) IV     PRN: HYDROmorphone (DILAUDID) injection, ondansetron (ZOFRAN) IV Anti-infectives   Start     Dose/Rate Route Frequency Ordered Stop   08/20/12 0800  Ampicillin-Sulbactam (UNASYN) 3 g in sodium chloride 0.9 % 100 mL IVPB     3 g 100 mL/hr over 60 Minutes Intravenous Every 6 hours 08/20/12 0716       Assessment:  27 yo M presents with acute cholecystitis starting, currently in CT. Starting  Unasyn per pharmacy  Renal function WNL  Wt - none available at this time   Goal of Therapy:  Unasyn per renal function   Plan:  1.) Unasyn 3 grams IV q6h  2.) Monitor renal function 3.) F/u CT  Diania Co, Loma Messing PharmD Pager #: [phone] 7:20 AM 08/20/2012

## 2012-08-20 NOTE — Anesthesia Postprocedure Evaluation (Signed)
  Anesthesia Post-op Note  Patient: Joshua Barron  Procedure(s) Performed: Procedure(s) (LRB): LAPAROSCOPIC CHOLECYSTECTOMY WITH INTRAOPERATIVE CHOLANGIOGRAM (N/A)  Patient Location: PACU  Anesthesia Type: General  Level of Consciousness: awake and alert   Airway and Oxygen Therapy: Patient Spontanous Breathing  Post-op Pain: mild  Post-op Assessment: Post-op Vital signs reviewed, Patient's Cardiovascular Status Stable, Respiratory Function Stable, Patent Airway and No signs of Nausea or vomiting  Last Vitals:  Filed Vitals:   08/20/12 1332  BP: 126/65  Pulse: 60  Temp: 36.6 C  Resp: 12    Post-op Vital Signs: stable   Complications: No apparent anesthesia complications

## 2012-08-20 NOTE — ED Notes (Signed)
Per EMS, pt.from home with complaint of abdominal pain at 10/10 ,  Nausea and vomiting since last night . Pt. Has been experiencing abdominal pain for a month now but  Worsen at this time. Alert and oriented x 4. Received 4mg  IV Zofran on board. Denies SOB.

## 2012-08-20 NOTE — ED Provider Notes (Signed)
History     CSN: [id number]  Arrival date & time 08/20/12  0417   First MD Initiated Contact with Patient 08/20/12 0454      Chief Complaint  Patient presents with  . Abdominal Pain  . Emesis  . Nausea    (Consider location/radiation/quality/duration/timing/severity/associated sxs/prior treatment) HPI Comments: Pt with h/o ulcer disease, takes antacids prn at home, was in usual state of health, had rather sudden onset of severe diffuse pain associated with n/V.  Some diarrhea as well.  No testicular pain.  No radiating of pain to back.  Never had pain this severe in the past.  Denies drinking, drugs.  No prior surgeries in the past.  Level 5 caveat due to severe pain, urgent need for intervention.  Patient is a 27 y.o. male presenting with abdominal pain and vomiting. The history is provided by the patient and a relative.  Abdominal Pain This is a new problem. The current episode started 3 to 5 hours ago. The problem occurs constantly. The problem has been gradually worsening. Associated symptoms include abdominal pain. The symptoms are aggravated by walking. Nothing relieves the symptoms. He has tried nothing for the symptoms.  Emesis Associated symptoms: abdominal pain     Past Medical History  Diagnosis Date  . Peptic ulcer disease     History reviewed. No pertinent past surgical history.  History reviewed. No pertinent family history.  History  Substance Use Topics  . Smoking status: Never Smoker   . Smokeless tobacco: Not on file  . Alcohol Use: Not on file     Comment: occasional      Review of Systems  Unable to perform ROS: Acuity of condition  Gastrointestinal: Positive for vomiting and abdominal pain.    Allergies  Review of patient's allergies indicates no known allergies.  Home Medications   Current Outpatient Rx  Name  Route  Sig  Dispense  Refill  . ranitidine (ZANTAC) 75 MG tablet   Oral   Take 75 mg by mouth 2 (two) times daily as needed for  heartburn.           BP 131/79  Pulse 56  Temp(Src) 98.5 F (36.9 C) (Oral)  Resp 20  SpO2 99%  Physical Exam  Nursing note and vitals reviewed. Constitutional: He appears well-developed and well-nourished.  Non-toxic appearance. He does not have a sickly appearance. He appears ill. He appears distressed.  HENT:  Head: Normocephalic and atraumatic.  Eyes: Conjunctivae and EOM are normal. No scleral icterus.  Neck: Normal range of motion. Neck supple.  Cardiovascular: Normal rate and regular rhythm.   Pulmonary/Chest: Effort normal and breath sounds normal. He has no wheezes. He has no rales.  Abdominal: Soft. He exhibits no distension. There is tenderness. There is rebound and guarding.  Genitourinary: Right testis shows no tenderness. Left testis shows no tenderness.  Neurological: He is alert.  Skin: Skin is warm. No rash noted. He is diaphoretic. No pallor.    ED Course  Procedures (including critical care time)  Labs Reviewed  CBC WITH DIFFERENTIAL - Abnormal; Notable for the following:    WBC 12.9 (*)    Neutro Abs 10.0 (*)    All other components within normal limits  COMPREHENSIVE METABOLIC PANEL - Abnormal; Notable for the following:    Glucose, Bld 121 (*)    All other components within normal limits  URINALYSIS, ROUTINE W REFLEX MICROSCOPIC - Abnormal; Notable for the following:    Specific Gravity, Urine 1.035 (*)  All other components within normal limits  POCT I-STAT, CHEM 8 - Abnormal; Notable for the following:    Glucose, Bld 119 (*)    Calcium, Ion 1.10 (*)    All other components within normal limits  LIPASE, BLOOD  CG4 I-STAT (LACTIC ACID)   Dg Abd Acute W/chest  08/20/2012   *RADIOLOGY REPORT*  Clinical Data: Abdominal pain, nausea.  ACUTE ABDOMEN SERIES (ABDOMEN 2 VIEW & CHEST 1 VIEW)  Comparison: 07/16/2008  Findings: Lungs clear.  No effusion.  Heart size upper limits normal.  No free air.  Normal bowel gas pattern.  Left pelvic phlebolith.   Regional bones unremarkable.  IMPRESSION:  1.  Normal bowel gas pattern. 2.  No free air. 3.  No acute cardiopulmonary disease.   Original Report Authenticated By: D. Andria Rhein, MD     1. Cholecystitis, acute     ra sat is 99% and I interpret to be normal   6:54 AM Additional IV dilaudid improved pain to tolerate exam better.  Pt had pain and guarding more in right side, and more RUQ and upper epigastric.  CT scan which I reviewed myself shows distended GB with wall thickening and pericholecystic fluid suggesting acute cholecystitis.  Will consult general surgery.   7:13 AM Spoke to radiologist and reviewed CT which confirm acute cholecystitis . Discussed with general surgeon Dr. Daphine Deutscher who will see pt in the ED, agrees with pain control and IV unasyn. MDM  Pt with sudden severe pain, concern for perforation, colic.  Plain films of abdomen shows no free air on my interpretation, will get CT scan.          Gavin Pound. Oletta Lamas, MD 08/20/12 [phone]

## 2012-08-21 ENCOUNTER — Encounter (HOSPITAL_COMMUNITY): Payer: Self-pay | Admitting: Surgery

## 2012-08-21 LAB — CBC
HCT: 42.5 % (ref 39.0–52.0)
MCV: 91.2 fL (ref 78.0–100.0)
RBC: 4.66 MIL/uL (ref 4.22–5.81)
WBC: 17.6 10*3/uL — ABNORMAL HIGH (ref 4.0–10.5)

## 2012-08-21 LAB — COMPREHENSIVE METABOLIC PANEL
AST: 45 U/L — ABNORMAL HIGH (ref 0–37)
Albumin: 3.1 g/dL — ABNORMAL LOW (ref 3.5–5.2)
BUN: 7 mg/dL (ref 6–23)
Creatinine, Ser: 0.86 mg/dL (ref 0.50–1.35)
Potassium: 4.2 mEq/L (ref 3.5–5.1)
Total Protein: 6.9 g/dL (ref 6.0–8.3)

## 2012-08-21 MED ORDER — PHENOL 1.4 % MT LIQD
1.0000 | OROMUCOSAL | Status: DC | PRN
  Administered 2012-08-21: 1 via OROMUCOSAL
  Filled 2012-08-21: qty 177

## 2012-08-21 NOTE — Progress Notes (Cosign Needed)
Patient ID: Joshua Barron, male   DOB: 08-21-1985, 27 y.o.   MRN: [id number] 1 Day Post-Op  Subjective: Pain this am, had trouble with pain overnight, denies n/v but just doesn't feel good, denies fever/chills  Objective: Vital signs in last 24 hours: Temp:  [97.5 F (36.4 C)-98.7 F (37.1 C)] 98.7 F (37.1 C) (06/16 0617) Pulse Rate:  [53-85] 54 (06/16 0617) Resp:  [10-21] 18 (06/16 0617) BP: (114-169)/(61-92) 114/63 mmHg (06/16 0617) SpO2:  [94 %-100 %] 100 % (06/16 0617) Weight:  [155 lb (70.308 kg)] 155 lb (70.308 kg) (06/15 2101) Last BM Date: 08/19/12  Intake/Output from previous day: 06/15 0701 - 06/16 0700 In: 2230 [P.O.:30; I.V.:2200] Out: 3075 [Urine:3025; Blood:50] Intake/Output this shift: Total I/O In: 0  Out: 725 [Urine:725]  PE: Abd: tender over incisions, milldy distended +guarding, +BS, incisions c/d/i General: awake, alert, NAD  Lab Results:   Recent Labs  08/20/12 0440 08/20/12 0447 08/21/12 0422  WBC 12.9*  --  17.6*  HGB 14.3 16.0 13.8  HCT 41.8 47.0 42.5  PLT 297  --  296   BMET  Recent Labs  08/20/12 0440 08/20/12 0447 08/21/12 0422  NA 136 140 139  K 3.9 3.8 4.2  CL 100 105 103  CO2 27  --  30  GLUCOSE 121* 119* 117*  BUN 9 9 7   CREATININE 0.77 0.80 0.86  CALCIUM 9.2  --  9.2   PT/INR No results found for this basename: LABPROT, INR,  in the last 72 hours CMP     Component Value Date/Time   NA 139 08/21/2012 0422   K 4.2 08/21/2012 0422   CL 103 08/21/2012 0422   CO2 30 08/21/2012 0422   GLUCOSE 117* 08/21/2012 0422   BUN 7 08/21/2012 0422   CREATININE 0.86 08/21/2012 0422   CALCIUM 9.2 08/21/2012 0422   PROT 6.9 08/21/2012 0422   ALBUMIN 3.1* 08/21/2012 0422   AST 45* 08/21/2012 0422   ALT 60* 08/21/2012 0422   ALKPHOS 67 08/21/2012 0422   BILITOT 0.5 08/21/2012 0422   GFRNONAA >90 08/21/2012 0422   GFRAA >90 08/21/2012 0422   Lipase     Component Value Date/Time   LIPASE 34 08/20/2012 0440       Studies/Results: Dg  Cholangiogram Operative  08/20/2012   *RADIOLOGY REPORT*  Clinical Data: Cholecystitis.  INTRAOPERATIVE CHOLANGIOGRAM  Technique:  Multiple fluoroscopic spot radiographs were obtained during intraoperative cholangiogram and are submitted for interpretation post-operatively.  Findings:  No calculi are identified within the common hepatic or common bile ducts.  There is no evidence of common duct stricture or obstruction.  Prompt contrast emptying into the duodenum is seen.  Visualized intrahepatic bile ducts are unremarkable in appearance.  IMPRESSION: Negative intraoperative cholangiogram.  No evidence of biliary calculi or obstruction.   Original Report Authenticated By: Myles Rosenthal, M.D.   Ct Abdomen Pelvis W Contrast  08/20/2012   *RADIOLOGY REPORT*  Clinical Data: Abdominal pain.  CT ABDOMEN AND PELVIS WITH CONTRAST  Technique:  Multidetector CT imaging of the abdomen and pelvis was performed following the standard protocol during bolus administration of intravenous contrast.  Contrast: 50mL OMNIPAQUE IOHEXOL 300 MG/ML  SOLN, OMNIPAQUE IOHEXOL 300 MG/ML  SOLN  Comparison: Abdominal series 08/10/2012  Findings: The lung bases are clear.  There is no evidence for pneumoperitoneum.  There appears to be diffuse wall thickening of the gallbladder with hyperemia of the gallbladder mucosa.  There is also hyperemia in the adjacent liver.  Findings are concerning for gallbladder wall inflammation.  There is no significant dilatation of the extrahepatic biliary system. No evidence for pancreatic duct dilatation.  However, there is an indeterminate 5 mm low density pancreatic structure near the pancreatic head.  This is best seen on the coronal reformats, image 33.  Normal appearance of the spleen, adrenal glands and both kidneys. There may be a trace amount of fluid in the pelvis.  No gross abnormality to the prostate, seminal vesicles or bladder. No acute bony abnormality.  IMPRESSION: Diffuse gallbladder wall  thickening and hyperemia of the adjacent liver.  Findings are concerning for inflammation and acute cholecystitis. Trace amount of free fluid in the pelvis.  This gallbladder could be further evaluated with ultrasound to look for stones.  Indeterminate 5 mm low density structure near the pancreatic head. This small low density area could be further evaluated with a follow-up MRI of the abdomen in 1 year to ensure stability.   Original Report Authenticated By: Richarda Overlie, M.D.   Dg Abd Acute W/chest  08/20/2012   *RADIOLOGY REPORT*  Clinical Data: Abdominal pain, nausea.  ACUTE ABDOMEN SERIES (ABDOMEN 2 VIEW & CHEST 1 VIEW)  Comparison: 07/16/2008  Findings: Lungs clear.  No effusion.  Heart size upper limits normal.  No free air.  Normal bowel gas pattern.  Left pelvic phlebolith.  Regional bones unremarkable.  IMPRESSION:  1.  Normal bowel gas pattern. 2.  No free air. 3.  No acute cardiopulmonary disease.   Original Report Authenticated By: D. Andria Rhein, MD    Anti-infectives: Anti-infectives   Start     Dose/Rate Route Frequency Ordered Stop   08/20/12 1230  Ampicillin-Sulbactam (UNASYN) 3 g in sodium chloride 0.9 % 100 mL IVPB  Status:  Discontinued     3 g 100 mL/hr over 60 Minutes Intravenous Every 6 hours 08/20/12 1228 08/20/12 1234   08/20/12 0800  Ampicillin-Sulbactam (UNASYN) 3 g in sodium chloride 0.9 % 100 mL IVPB  Status:  Discontinued     3 g 100 mL/hr over 60 Minutes Intravenous Every 6 hours 08/20/12 0716 08/20/12 1340       Assessment/Plan POAD#1-lap chole: puny this am, per pt pain not well controlled, getting regular diet this am and will see how he tolerates this, WBC up-?reactive, may need to stay here today to monitor and recheck tomorrow, will recheck later today   LOS: 1 day    Abdiel Blackerby 08/21/2012

## 2012-08-22 ENCOUNTER — Observation Stay (HOSPITAL_COMMUNITY): Payer: BC Managed Care – PPO

## 2012-08-22 ENCOUNTER — Encounter (HOSPITAL_COMMUNITY): Payer: Self-pay | Admitting: Radiology

## 2012-08-22 LAB — CBC
HCT: 40.5 % (ref 39.0–52.0)
Hemoglobin: 13.1 g/dL (ref 13.0–17.0)
MCH: 29.9 pg (ref 26.0–34.0)
MCHC: 32.3 g/dL (ref 30.0–36.0)
MCV: 92.5 fL (ref 78.0–100.0)
Platelets: 277 10*3/uL (ref 150–400)
RBC: 4.38 MIL/uL (ref 4.22–5.81)
RDW: 12.8 % (ref 11.5–15.5)
WBC: 11.7 10*3/uL — ABNORMAL HIGH (ref 4.0–10.5)

## 2012-08-22 MED ORDER — TECHNETIUM TC 99M MEBROFENIN IV KIT
4.8000 | PACK | Freq: Once | INTRAVENOUS | Status: AC | PRN
  Administered 2012-08-22: 4.8 via INTRAVENOUS

## 2012-08-22 MED ORDER — HEPARIN SODIUM (PORCINE) 5000 UNIT/ML IJ SOLN
5000.0000 [IU] | Freq: Once | INTRAMUSCULAR | Status: AC
  Administered 2012-08-22: 5000 [IU] via SUBCUTANEOUS
  Filled 2012-08-22: qty 1

## 2012-08-22 MED ORDER — DOCUSATE SODIUM 100 MG PO CAPS
100.0000 mg | ORAL_CAPSULE | Freq: Two times a day (BID) | ORAL | Status: DC | PRN
  Filled 2012-08-22: qty 1

## 2012-08-22 MED ORDER — POLYETHYLENE GLYCOL 3350 17 G PO PACK
17.0000 g | PACK | Freq: Every day | ORAL | Status: DC
  Administered 2012-08-22: 17 g via ORAL
  Filled 2012-08-22 (×2): qty 1

## 2012-08-22 NOTE — Progress Notes (Signed)
Patient ID: Joshua Barron, male   DOB: [birth year], 27 y.o.   MRN: [id number] 2 Days Post-Op  Subjective: Worsening abdominal pain along right flank, denies n/v, pain shart and is 8/10 right now, abd very tight, min flatus,   Objective: Vital signs in last 24 hours: Temp:  [98.2 F (36.8 C)-98.6 F (37 C)] 98.2 F (36.8 C) (06/17 0539) Pulse Rate:  [56-81] 76 (06/17 0539) Resp:  [16-18] 18 (06/17 0539) BP: (97-130)/(53-90) 130/90 mmHg (06/17 0539) SpO2:  [100 %] 100 % (06/17 0539) Last BM Date: 08/19/12  Intake/Output from previous day: 06/16 0701 - 06/17 0700 In: 2760 [P.O.:360; I.V.:2400] Out: 2475 [Urine:2475] Intake/Output this shift: Total I/O In: 240 [P.O.:240] Out: 300 [Urine:300]  PE: Abd: tender along right flank and RUQ, distended,  +guarding, min BS, incisions c/d/i General: awake, alert, appears in pain and doesn't want to move due to pain  Lab Results:   Recent Labs  08/21/12 0422 08/22/12 0425  WBC 17.6* 11.7*  HGB 13.8 13.1  HCT 42.5 40.5  PLT 296 277   BMET  Recent Labs  08/20/12 0440 08/20/12 0447 08/21/12 0422  NA 136 140 139  K 3.9 3.8 4.2  CL 100 105 103  CO2 27  --  30  GLUCOSE 121* 119* 117*  BUN 9 9 7   CREATININE 0.77 0.80 0.86  CALCIUM 9.2  --  9.2   PT/INR No results found for this basename: LABPROT, INR,  in the last 72 hours CMP     Component Value Date/Time   NA 139 08/21/2012 0422   K 4.2 08/21/2012 0422   CL 103 08/21/2012 0422   CO2 30 08/21/2012 0422   GLUCOSE 117* 08/21/2012 0422   BUN 7 08/21/2012 0422   CREATININE 0.86 08/21/2012 0422   CALCIUM 9.2 08/21/2012 0422   PROT 6.9 08/21/2012 0422   ALBUMIN 3.1* 08/21/2012 0422   AST 45* 08/21/2012 0422   ALT 60* 08/21/2012 0422   ALKPHOS 67 08/21/2012 0422   BILITOT 0.5 08/21/2012 0422   GFRNONAA >90 08/21/2012 0422   GFRAA >90 08/21/2012 0422   Lipase     Component Value Date/Time   LIPASE 34 08/20/2012 0440       Studies/Results: Dg Cholangiogram  Operative  08/20/2012   *RADIOLOGY REPORT*  Clinical Data: Cholecystitis.  INTRAOPERATIVE CHOLANGIOGRAM  Technique:  Multiple fluoroscopic spot radiographs were obtained during intraoperative cholangiogram and are submitted for interpretation post-operatively.  Findings:  No calculi are identified within the common hepatic or common bile ducts.  There is no evidence of common duct stricture or obstruction.  Prompt contrast emptying into the duodenum is seen.  Visualized intrahepatic bile ducts are unremarkable in appearance.  IMPRESSION: Negative intraoperative cholangiogram.  No evidence of biliary calculi or obstruction.   Original Report Authenticated By: Myles Rosenthal, M.D.    Anti-infectives: Anti-infectives   Start     Dose/Rate Route Frequency Ordered Stop   08/20/12 1230  Ampicillin-Sulbactam (UNASYN) 3 g in sodium chloride 0.9 % 100 mL IVPB  Status:  Discontinued     3 g 100 mL/hr over 60 Minutes Intravenous Every 6 hours 08/20/12 1228 08/20/12 1234   08/20/12 0800  Ampicillin-Sulbactam (UNASYN) 3 g in sodium chloride 0.9 % 100 mL IVPB  Status:  Discontinued     3 g 100 mL/hr over 60 Minutes Intravenous Every 6 hours 08/20/12 0716 08/20/12 1340       Assessment/Plan POAD#2-lap chole: worsening pain and distention, guarding a lot, min bowel sounds,  will order ultrasound to eval for fluid collection/new problems, possible post-op ileus but ?cause, will follow up after u/s, unsure that he will be ready to go home today  LOS: 2 days    Norva Bowe, [facility] 08/22/2012

## 2012-08-23 ENCOUNTER — Encounter (INDEPENDENT_AMBULATORY_CARE_PROVIDER_SITE_OTHER): Payer: Self-pay | Admitting: Internal Medicine

## 2012-08-23 MED ORDER — DSS 100 MG PO CAPS: 100.0000 mg | ORAL_CAPSULE | Freq: Two times a day (BID) | ORAL | Status: DC | PRN

## 2012-08-23 MED ORDER — HYDROCODONE-ACETAMINOPHEN 5-325 MG PO TABS: 1.0000 | ORAL_TABLET | ORAL | Status: DC | PRN

## 2012-08-23 MED ORDER — POLYETHYLENE GLYCOL 3350 17 G PO PACK: 17.0000 g | PACK | Freq: Every day | ORAL | Status: DC

## 2012-08-23 NOTE — Discharge Instructions (Signed)
[facility] ______[facility] °LAPAROSCOPIC SURGERY: POST OP INSTRUCTIONS °Always review your discharge instruction sheet given to you by the facility where your surgery was performed. °IF YOU HAVE DISABILITY OR FAMILY LEAVE FORMS, YOU MUST BRING THEM TO THE OFFICE FOR PROCESSING.   °DO NOT GIVE THEM TO YOUR DOCTOR. ° °1. A prescription for pain medication may be given to you upon discharge.  Take your pain medication as prescribed, if needed.  If narcotic pain medicine is not needed, then you may take acetaminophen (Tylenol) or ibuprofen (Advil) as needed. °2. Take your usually prescribed medications unless otherwise directed. °3. If you need a refill on your pain medication, please contact your pharmacy.  They will contact our office to request authorization. Prescriptions will not be filled after 5pm or on week-ends. °4. You should follow a light diet the first few days after arrival home, such as soup and crackers, etc.  Be sure to include lots of fluids daily. °5. Most patients will experience some swelling and bruising in the area of the incisions.  Ice packs will help.  Swelling and bruising can take several days to resolve.  °6. It is common to experience some constipation if taking pain medication after surgery.  Increasing fluid intake and taking a stool softener (such as Colace) will usually help or prevent this problem from occurring.  A mild laxative (Milk of Magnesia or Miralax) should be taken according to package instructions if there are no bowel movements after 48 hours. °7. Unless discharge instructions indicate otherwise, you may remove your bandages 24-48 hours after surgery, and you may shower at that time.  You may have steri-strips (small skin tapes) in place directly over the incision.  These strips should be left on the skin for 7-10 days.  If your surgeon used skin glue on the incision, you may shower in 24 hours.  The glue will flake off over the next 2-3 weeks.  Any sutures or  staples will be removed at the office during your follow-up visit. °8. ACTIVITIES:  You may resume regular (light) daily activities beginning the next day--such as daily self-care, walking, climbing stairs--gradually increasing activities as tolerated.  You may have sexual intercourse when it is comfortable.  Refrain from any heavy lifting or straining until approved by your doctor. °a. You may drive when you are no longer taking prescription pain medication, you can comfortably wear a seatbelt, and you can safely maneuver your car and apply brakes. °b. RETURN TO WORK:  __________________________________________________________ °9. You should see your doctor in the office for a follow-up appointment approximately 2-3 weeks after your surgery.  Make sure that you call for this appointment within a day or two after you arrive home to insure a convenient appointment time. °10. OTHER INSTRUCTIONS: __________________________________________________________________________________________________________________________ __________________________________________________________________________________________________________________________ °WHEN TO CALL YOUR DOCTOR: °1. Fever over 101.0 °2. Inability to urinate °3. Continued bleeding from incision. °4. Increased pain, redness, or drainage from the incision. °5. Increasing abdominal pain ° °The clinic staff is available to answer your questions during regular business hours.  Please don’t hesitate to call and ask to speak to one of the nurses for clinical concerns.  If you have a medical emergency, go to the nearest emergency room or call 911.  A surgeon from [facility] is always on call at the hospital. °[address], [address], [place], [place]  [address] ? [address] [address], [place], [place]   [address] °[phone] ? [phone] ? FAX [phone] °Web site:   [email or web address] °

## 2012-08-23 NOTE — Discharge Summary (Signed)
  Physician Discharge Summary  Patient ID: Joshua Barron MRN: [id number] DOB/AGE: [birth year] 27 y.o.  Admit date: 08/20/2012 Discharge date: 08/23/2012  Admitting Diagnosis: Acute cholecystitis with stones impacted in neck of gallbladder  Discharge Diagnosis Same  Consultants None  Procedures Laparoscopic Cholecystectomy with [facility]  Hospital Course 27 yr old male who presented to [facility] with abdominal pain, nausea and vomiting.  Workup showed Acute cholecystitis with stones impacted in neck of gallbladder.  Patient was admitted and underwent procedure listed above.  Tolerated procedure well and was transferred to the floor.  Diet was advanced as tolerated.  He did have some post-operative pain that seemed out of proportion so a u/s and HIDA were done which were normal.  On POD#2, the patient was voiding well, tolerating diet, ambulating well, pain well controlled, vital signs stable, incisions c/d/i and felt stable for discharge home.  Patient will follow up in our office in 2 weeks and knows to call with questions or concerns.  Physical Exam: VSS afebrile General: awake, nad Abd: softer, mildly tender over incisions, +BS, incisions c/d/i    Medication List    TAKE these medications       DSS 100 MG Caps  Take 100 mg by mouth 2 (two) times daily as needed.     HYDROcodone-acetaminophen 5-325 MG per tablet  Commonly known as:  NORCO/VICODIN  Take 1-2 tablets by mouth every 4 (four) hours as needed.     polyethylene glycol packet  Commonly known as:  MIRALAX / GLYCOLAX  Take 17 g by mouth daily.     ranitidine 75 MG tablet  Commonly known as:  ZANTAC  Take 75 mg by mouth 2 (two) times daily as needed for heartburn.             Follow-up Information   Follow up with [facility] In 2 weeks.   Contact information:   [address] [place] [address] [phone]       Signed: Denny Levy [facility] [phone]  08/23/2012, 8:36 AM

## 2012-09-05 ENCOUNTER — Telehealth (INDEPENDENT_AMBULATORY_CARE_PROVIDER_SITE_OTHER): Payer: Self-pay

## 2012-09-05 ENCOUNTER — Encounter (INDEPENDENT_AMBULATORY_CARE_PROVIDER_SITE_OTHER): Payer: BC Managed Care – PPO

## 2012-09-05 NOTE — Telephone Encounter (Signed)
Attempted to contact pt to see why he missed his 1145am appt with [facility] today.  Home number gave a busy tone three times.  Work number rang and rang.

## 2013-04-02 ENCOUNTER — Encounter (HOSPITAL_COMMUNITY): Payer: Self-pay | Admitting: Emergency Medicine

## 2013-04-02 ENCOUNTER — Emergency Department (HOSPITAL_COMMUNITY)
Admission: EM | Admit: 2013-04-02 | Discharge: 2013-04-02 | Disposition: A | Discharge status: Home / Self Care | Payer: BC Managed Care – PPO | Attending: Emergency Medicine | Admitting: Emergency Medicine

## 2013-04-02 DIAGNOSIS — R1110 Vomiting, unspecified: Secondary | ICD-10-CM | POA: Insufficient documentation

## 2013-04-02 DIAGNOSIS — Z87891 Personal history of nicotine dependence: Secondary | ICD-10-CM | POA: Insufficient documentation

## 2013-04-02 DIAGNOSIS — R1084 Generalized abdominal pain: Secondary | ICD-10-CM | POA: Insufficient documentation

## 2013-04-02 DIAGNOSIS — R109 Unspecified abdominal pain: Secondary | ICD-10-CM

## 2013-04-02 DIAGNOSIS — Z8711 Personal history of peptic ulcer disease: Secondary | ICD-10-CM | POA: Insufficient documentation

## 2013-04-02 DIAGNOSIS — R51 Headache: Secondary | ICD-10-CM | POA: Insufficient documentation

## 2013-04-02 DIAGNOSIS — R519 Headache, unspecified: Secondary | ICD-10-CM

## 2013-04-02 LAB — CBC WITH DIFFERENTIAL/PLATELET
BASOS PCT: 0 % (ref 0–1)
Basophils Absolute: 0 10*3/uL (ref 0.0–0.1)
Eosinophils Absolute: 0.3 10*3/uL (ref 0.0–0.7)
Eosinophils Relative: 3 % (ref 0–5)
HEMATOCRIT: 43.8 % (ref 39.0–52.0)
HEMOGLOBIN: 14.5 g/dL (ref 13.0–17.0)
LYMPHS ABS: 1.5 10*3/uL (ref 0.7–4.0)
LYMPHS PCT: 20 % (ref 12–46)
MCH: 31 pg (ref 26.0–34.0)
MCHC: 33.1 g/dL (ref 30.0–36.0)
MCV: 93.8 fL (ref 78.0–100.0)
MONO ABS: 0.7 10*3/uL (ref 0.1–1.0)
MONOS PCT: 10 % (ref 3–12)
NEUTROS ABS: 5 10*3/uL (ref 1.7–7.7)
NEUTROS PCT: 67 % (ref 43–77)
Platelets: 325 10*3/uL (ref 150–400)
RBC: 4.67 MIL/uL (ref 4.22–5.81)
RDW: 13.4 % (ref 11.5–15.5)
WBC: 7.6 10*3/uL (ref 4.0–10.5)

## 2013-04-02 LAB — URINALYSIS, ROUTINE W REFLEX MICROSCOPIC
Bilirubin Urine: NEGATIVE
GLUCOSE, UA: NEGATIVE mg/dL
HGB URINE DIPSTICK: NEGATIVE
KETONES UR: NEGATIVE mg/dL
Nitrite: NEGATIVE
PROTEIN: NEGATIVE mg/dL
Specific Gravity, Urine: 1.026 (ref 1.005–1.030)
UROBILINOGEN UA: 0.2 mg/dL (ref 0.0–1.0)
pH: 7 (ref 5.0–8.0)

## 2013-04-02 LAB — COMPREHENSIVE METABOLIC PANEL
ALBUMIN: 3.9 g/dL (ref 3.5–5.2)
ALK PHOS: 57 U/L (ref 39–117)
ALT: 12 U/L (ref 0–53)
AST: 14 U/L (ref 0–37)
BILIRUBIN TOTAL: 0.4 mg/dL (ref 0.3–1.2)
BUN: 8 mg/dL (ref 6–23)
CHLORIDE: 102 meq/L (ref 96–112)
CO2: 28 meq/L (ref 19–32)
Calcium: 9.1 mg/dL (ref 8.4–10.5)
Creatinine, Ser: 0.85 mg/dL (ref 0.50–1.35)
GFR calc Af Amer: >90 mL/min (ref >90)
Glucose, Bld: 90 mg/dL (ref 70–99)
POTASSIUM: 4.2 meq/L (ref 3.7–5.3)
Sodium: 139 mEq/L (ref 137–147)
Total Protein: 7.8 g/dL (ref 6.0–8.3)

## 2013-04-02 LAB — URINE MICROSCOPIC-ADD ON

## 2013-04-02 MED ORDER — METOCLOPRAMIDE HCL 5 MG/ML IJ SOLN
10.0000 mg | Freq: Once | INTRAMUSCULAR | Status: AC
  Administered 2013-04-02: 10 mg via INTRAVENOUS
  Filled 2013-04-02: qty 2

## 2013-04-02 MED ORDER — DIPHENHYDRAMINE HCL 50 MG/ML IJ SOLN
25.0000 mg | Freq: Once | INTRAMUSCULAR | Status: AC
  Administered 2013-04-02: 25 mg via INTRAVENOUS
  Filled 2013-04-02: qty 1

## 2013-04-02 NOTE — Discharge Instructions (Signed)
You are having a headache. No specific cause was found today for your headache. It may have been a migraine or other cause of headache. Stress, anxiety, fatigue, and depression are common triggers for headaches. Your headache today does not appear to be life-threatening or require hospitalization, but often the exact cause of headaches is not determined in the emergency department. Therefore, follow-up with your doctor is very important to find out what may have caused your headache, and whether or not you need any further diagnostic testing or treatment. Sometimes headaches can appear benign (not harmful), but then more serious symptoms can develop which should prompt an immediate re-evaluation by your doctor or the emergency department.  SEEK MEDICAL ATTENTION IF:  You develop possible problems with medications prescribed.  The medications don't resolve your headache, if it recurs , or if you have multiple episodes of vomiting or can't take fluids. You have a change from the usual headache.  RETURN IMMEDIATELY IF you develop a sudden, severe headache or confusion, become poorly responsive or faint, develop a fever above 100.75F or problem breathing, have a change in speech, vision, swallowing, or understanding, or develop new weakness, numbness, tingling, incoordination, or have a seizure.  Abdominal (belly) pain can be caused by many things. any cases can be observed and treated at home after initial evaluation in the emergency department. Even though you are being discharged home, abdominal pain can be unpredictable. Therefore, you need a repeated exam if your pain does not resolve, returns, or worsens. Most patients with abdominal pain don't have to be admitted to the hospital or have surgery, but serious problems like appendicitis and gallbladder attacks can start out as nonspecific pain. Many abdominal conditions cannot be diagnosed in one visit, so follow-up evaluations are very important. SEEK  IMMEDIATE MEDICAL ATTENTION IF: The pain does not go away or becomes severe, particularly over the next 8-12 hours.  A temperature above 100.75F develops.  Repeated vomiting occurs (multiple episodes).  The pain becomes localized to portions of the abdomen. The right side could possibly be appendicitis. In an adult, the left lower portion of the abdomen could be colitis or diverticulitis.  Blood is being passed in stools or vomit (bright red or black tarry stools).  Return also if you develop chest pain, difficulty breathing, dizziness or fainting, or become confused, poorly responsive

## 2013-04-02 NOTE — ED Provider Notes (Signed)
CSN: [id number]     Arrival date & time 04/02/13  1144 History   First MD Initiated Contact with Patient 04/02/13 1704     Chief Complaint  Patient presents with  . Abdominal Pain  . Headache    Patient is a 28 y.o. male presenting with headaches. The history is provided by the patient.  Headache Pain location:  Generalized Quality:  Dull Radiates to:  Does not radiate Onset quality:  Gradual Duration:  1 week Timing:  Constant Progression:  Unchanged Chronicity:  Recurrent Similar to prior headaches: yes   Relieved by:  Nothing Worsened by:  Nothing tried Associated symptoms: vomiting   Associated symptoms: no blurred vision, no diarrhea, no fever, no focal weakness and no syncope   pt reports he has had HA for past week, gradual in onset, similar to prior, no focal weakness/fever He has vomited once. No head trauma  Also reports RLQ abd pain since last night, no fever, no dysuria, pain has been constant, similar to prior episodes of abd pain since his previous cholecystectomy    Past Medical History  Diagnosis Date  . Peptic ulcer disease   . Abdominal pain    Past Surgical History  Procedure Laterality Date  . Cholecystectomy N/A 08/20/2012    Procedure: LAPAROSCOPIC CHOLECYSTECTOMY WITH INTRAOPERATIVE CHOLANGIOGRAM;  Surgeon: Valarie MerinoMatthew B Martin, MD;  Location: [facility];  Service: General;  Laterality: N/A;   No family history on file. History  Substance Use Topics  . Smoking status: Former Games developermoker  . Smokeless tobacco: Not on file  . Alcohol Use: No     Comment: occasional    Review of Systems  Constitutional: Negative for fever.  Eyes: Negative for blurred vision.  Cardiovascular: Negative for chest pain and syncope.  Gastrointestinal: Positive for vomiting. Negative for diarrhea and blood in stool.  Genitourinary: Negative for dysuria and testicular pain.  Neurological: Positive for headaches. Negative for focal weakness and weakness.  All other systems  reviewed and are negative.    Allergies  Review of patient's allergies indicates no known allergies.  Home Medications   Current Outpatient Rx  Name  Route  Sig  Dispense  Refill  . ibuprofen (ADVIL,MOTRIN) 200 MG tablet   Oral   Take 400 mg by mouth every 6 (six) hours as needed for moderate pain.          BP 112/65  Pulse 65  Temp(Src) 98.3 F (36.8 C) (Oral)  Resp 18  SpO2 99% Physical Exam CONSTITUTIONAL: Well developed/well nourished HEAD: Normocephalic/atraumatic EYES: EOMI/PERRL, no nystagmus ENMT: Mucous membranes moist NECK: supple no meningeal signs, no bruits SPINE:entire spine nontender CV: S1/S2 noted, no murmurs/rubs/gallops noted LUNGS: Lungs are clear to auscultation bilaterally, no apparent distress ABDOMEN: soft, mildl tenderness in RLQ, no rebound or guarding GU:no cva tenderness, no inguinal hernia noted NEURO:Awake/alert, facies symmetric, no arm or leg drift is noted Cranial nerves 3/4/5/6/09/13/08/11/12 tested and intact Gait normal without ataxia No past pointing EXTREMITIES: pulses normal, full ROM SKIN: warm, color normal PSYCH: no abnormalities of mood noted   ED Course  Procedures (including critical care time) Labs Review Labs Reviewed  URINALYSIS, ROUTINE W REFLEX MICROSCOPIC - Abnormal; Notable for the following:    Leukocytes, UA SMALL (*)    All other components within normal limits  URINE MICROSCOPIC-ADD ON - Abnormal; Notable for the following:    Squamous Epithelial / LPF FEW (*)    Bacteria, UA FEW (*)    All other components within normal  limits  URINE CULTURE  CBC WITH DIFFERENTIAL  COMPREHENSIVE METABOLIC PANEL   Imaging Review No results found.  EKG Interpretation   None       MDM  No diagnosis found. Nursing notes including past medical history and social history reviewed and considered in documentation Labs/vital reviewed and considered  For HA, will refer to [facility], I doubt acute neurologic  event For his abd pain, he is well appearing, no distress, I doubt acute abd process at this time, reports he has had this on/off since previous surgery Stable for d/c home    Joya Gaskins, MD 04/02/13 1815

## 2013-04-02 NOTE — ED Notes (Signed)
Pt is here with right sided headache for one week and denies fever.  Pt reports lower abdominal pain intermittent.  Pt vomited one time last nite.  No diarrhea or constipation

## 2013-04-03 LAB — URINE CULTURE
COLONY COUNT: NO GROWTH
CULTURE: NO GROWTH

## 2013-10-09 ENCOUNTER — Emergency Department (HOSPITAL_COMMUNITY)
Admission: EM | Admit: 2013-10-09 | Discharge: 2013-10-09 | Disposition: A | Discharge status: Home / Self Care | Payer: BC Managed Care – PPO | Attending: Emergency Medicine | Admitting: Emergency Medicine

## 2013-10-09 ENCOUNTER — Encounter (HOSPITAL_COMMUNITY): Payer: Self-pay | Admitting: Emergency Medicine

## 2013-10-09 DIAGNOSIS — T148XXA Other injury of unspecified body region, initial encounter: Secondary | ICD-10-CM

## 2013-10-09 DIAGNOSIS — Z87891 Personal history of nicotine dependence: Secondary | ICD-10-CM | POA: Insufficient documentation

## 2013-10-09 DIAGNOSIS — Y9389 Activity, other specified: Secondary | ICD-10-CM | POA: Insufficient documentation

## 2013-10-09 DIAGNOSIS — IMO0002 Reserved for concepts with insufficient information to code with codable children: Secondary | ICD-10-CM | POA: Insufficient documentation

## 2013-10-09 DIAGNOSIS — Z8711 Personal history of peptic ulcer disease: Secondary | ICD-10-CM | POA: Insufficient documentation

## 2013-10-09 DIAGNOSIS — Y92410 Unspecified street and highway as the place of occurrence of the external cause: Secondary | ICD-10-CM | POA: Insufficient documentation

## 2013-10-09 MED ORDER — METHOCARBAMOL 500 MG PO TABS: 1000.0000 mg | ORAL_TABLET | Freq: Four times a day (QID) | ORAL | Status: DC

## 2013-10-09 MED ORDER — IBUPROFEN 600 MG PO TABS: 600.0000 mg | ORAL_TABLET | Freq: Four times a day (QID) | ORAL | Status: DC | PRN
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
## 2013-10-09 NOTE — ED Provider Notes (Signed)
Medical screening examination/treatment/procedure(s) were performed by non-physician practitioner and as supervising physician I was immediately available for consultation/collaboration.    Jai Steil E Zaniya Mcaulay, MD 10/09/13 1204 
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
## 2013-11-03 ENCOUNTER — Emergency Department (HOSPITAL_COMMUNITY)
Admission: EM | Admit: 2013-11-03 | Discharge: 2013-11-04 | Disposition: A | Discharge status: Home / Self Care | Payer: BC Managed Care – PPO | Attending: Emergency Medicine | Admitting: Emergency Medicine

## 2013-11-03 ENCOUNTER — Encounter (HOSPITAL_COMMUNITY): Payer: Self-pay | Admitting: Emergency Medicine

## 2013-11-03 ENCOUNTER — Emergency Department (HOSPITAL_COMMUNITY): Payer: BC Managed Care – PPO

## 2013-11-03 DIAGNOSIS — S3981XA Other specified injuries of abdomen, initial encounter: Secondary | ICD-10-CM | POA: Diagnosis present

## 2013-11-03 DIAGNOSIS — J3489 Other specified disorders of nose and nasal sinuses: Secondary | ICD-10-CM | POA: Insufficient documentation

## 2013-11-03 DIAGNOSIS — R059 Cough, unspecified: Secondary | ICD-10-CM | POA: Diagnosis not present

## 2013-11-03 DIAGNOSIS — W219XXA Striking against or struck by unspecified sports equipment, initial encounter: Secondary | ICD-10-CM | POA: Insufficient documentation

## 2013-11-03 DIAGNOSIS — R05 Cough: Secondary | ICD-10-CM | POA: Insufficient documentation

## 2013-11-03 DIAGNOSIS — Y9239 Other specified sports and athletic area as the place of occurrence of the external cause: Secondary | ICD-10-CM | POA: Diagnosis not present

## 2013-11-03 DIAGNOSIS — Y9367 Activity, basketball: Secondary | ICD-10-CM | POA: Insufficient documentation

## 2013-11-03 DIAGNOSIS — Z79899 Other long term (current) drug therapy: Secondary | ICD-10-CM | POA: Diagnosis not present

## 2013-11-03 DIAGNOSIS — Y92838 Other recreation area as the place of occurrence of the external cause: Secondary | ICD-10-CM

## 2013-11-03 DIAGNOSIS — Z87891 Personal history of nicotine dependence: Secondary | ICD-10-CM | POA: Insufficient documentation

## 2013-11-03 DIAGNOSIS — R0789 Other chest pain: Secondary | ICD-10-CM

## 2013-11-03 DIAGNOSIS — S298XXA Other specified injuries of thorax, initial encounter: Secondary | ICD-10-CM | POA: Insufficient documentation

## 2013-11-03 DIAGNOSIS — Z8711 Personal history of peptic ulcer disease: Secondary | ICD-10-CM | POA: Insufficient documentation

## 2013-11-03 MED ORDER — IBUPROFEN 800 MG PO TABS
800.0000 mg | ORAL_TABLET | Freq: Once | ORAL | Status: AC
  Administered 2013-11-03: 800 mg via ORAL
  Filled 2013-11-03: qty 1
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
## 2013-11-03 NOTE — ED Provider Notes (Signed)
CSN: [id number]     Arrival date & time 11/03/13  2204 History   First MD Initiated Contact with Patient 11/03/13 2248     Chief Complaint  Patient presents with  . Abdominal Pain     (Consider location/radiation/quality/duration/timing/severity/associated sxs/prior Treatment) HPI  28 year old male with past medical history of cholecystectomy presenting with 2 hours of what he describes as upper right abdominal pain.  Initially I walk in the room patient is asleep on the stretcher. After waking patient up to interview him about his "abdominal pain". Patient states he began having his described pain at approximately 9 PM after playing basketball this evening. Patient references his pain in his right lower rib cage. Patient states his pain is constant, nothing changes, alleviates it, aggravates it, and he has taken 2 muscle relaxers which helped his pain. Patient also reports having some upper respiratory symptoms during the past week he states include a nonproductive cough, rhinorrhea. Past Medical History  Diagnosis Date  . Peptic ulcer disease   . Abdominal pain    Past Surgical History  Procedure Laterality Date  . Cholecystectomy N/A 08/20/2012    Procedure: LAPAROSCOPIC CHOLECYSTECTOMY WITH INTRAOPERATIVE CHOLANGIOGRAM;  Surgeon: Valarie Merino, MD;  Location: [facility];  Service: General;  Laterality: N/A;   History reviewed. No pertinent family history. History  Substance Use Topics  . Smoking status: Former Games developer  . Smokeless tobacco: Not on file  . Alcohol Use: Yes     Comment: occasional    Review of Systems  Constitutional: Negative for fever.  HENT: Positive for rhinorrhea. Negative for trouble swallowing.   Eyes: Negative for visual disturbance.  Respiratory: Positive for cough. Negative for shortness of breath, wheezing and stridor.   Cardiovascular: Negative for chest pain.  Gastrointestinal: Negative for nausea, vomiting and abdominal pain.  Genitourinary: Negative  for dysuria.  Musculoskeletal: Negative for neck pain.  Skin: Negative for rash.  Neurological: Negative for dizziness, weakness and numbness.  Psychiatric/Behavioral: Negative.       Allergies  Review of patient's allergies indicates no known allergies.  Home Medications   Prior to Admission medications   Medication Sig Start Date End Date Taking? Authorizing Provider  ibuprofen (ADVIL,MOTRIN) 600 MG tablet Take 600 mg by mouth every 6 (six) hours as needed for mild pain.   Yes Historical Provider, MD  methocarbamol (ROBAXIN) 500 MG tablet Take 1,000 mg by mouth 4 (four) times daily.   Yes Historical Provider, MD  ibuprofen (ADVIL,MOTRIN) 800 MG tablet Take 1 tablet (800 mg total) by mouth 3 (three) times daily. 11/04/13   Monte Fantasia, PA-C   BP 105/70  Pulse 109  Temp(Src) 99 F (37.2 C) (Oral)  Resp 12  SpO2 99% Physical Exam  Constitutional: He is oriented to person, place, and time. He appears well-developed and well-nourished. No distress.  HENT:  Head: Normocephalic and atraumatic.  Mouth/Throat: Oropharynx is clear and moist. No oropharyngeal exudate.  Eyes: Right eye exhibits no discharge. Left eye exhibits no discharge. No scleral icterus.  Neck: Normal range of motion.  Cardiovascular: Normal rate, regular rhythm and normal heart sounds.   No murmur heard. Pulmonary/Chest: Effort normal and breath sounds normal. No respiratory distress.    No deformity, ecchymosis, erythema, signs of trauma noted to patient's right lower rib cage.  Abdominal: Soft. Normal appearance and bowel sounds are normal. There is no tenderness.  Musculoskeletal: Normal range of motion. He exhibits no edema and no tenderness.  Neurological: He is alert and oriented  to person, place, and time. He has normal strength. No cranial nerve deficit or sensory deficit. He displays a negative Romberg sign. Coordination normal.  Skin: Skin is warm and dry. No rash noted. He is not diaphoretic.    Psychiatric: He has a normal mood and affect.    ED Course  Procedures (including critical care time) Labs Review Labs Reviewed - No data to display  Imaging Review Dg Chest 2 View  11/03/2013   CLINICAL DATA:  Right lower chest pain and shortness of breath.  EXAM: CHEST  2 VIEW  COMPARISON:  08/20/2012  FINDINGS: The heart size and mediastinal contours are within normal limits. Both lungs are clear. The visualized skeletal structures are unremarkable.  IMPRESSION: No active cardiopulmonary disease.   Electronically Signed   By: Burman Nieves M.D.   On: 11/03/2013 23:50     EKG Interpretation None      MDM   Final diagnoses:  Chest wall pain    28 year old male with past medical history of cholecystectomy presenting with 2 hours of what he describes as upper right abdominal pain. On exam tenderness is noted to right lower rib cage.. Initially I walk in the room patient is asleep on the stretcher. After waking patient up to interview him about his "abdominal pain". Patient states he began having his described pain at approximately 9 PM after playing basketball this evening. Patient states his pain is constant, nothing changes, alleviates it, aggravates it, and he has taken 2 muscle relaxers which helped his pain. Since patient's pain is reproducible in his right lower rib cage we'll do a chest x-ray to determine any bony abnormality. PERC score 0, indicative of <2% chance of PE.  Due to the fact the patient has no tenderness on abdominal exam, I believe his pain is of musculoskeletal etiology  12:30 AM: Patient states Motrin helped his rib pain and brought his pain down to 5/10.  Patient states she is very to home at this time.  Chest x-ray unremarkable. Patient remains stable, sleeping in bed, with no shortness of breath, no nausea, no vomiting and ED. Patient continues to have right-sided chest wall pain with no abdominal pain noted on exam. We will discharge patient with Motrin for  pain and have him followup with his PCP. We encouraged patient to call or return to the ER should his symptoms persist, worsen or should he have any questions or concerns.  Filed Vitals:   11/03/13 2245  BP: 105/70  Pulse:   Temp: 99 F (37.2 C)  Resp: 12    Signed,  Ladona Mow, PA-C 2:31 AM   This patient seen and discussed with Dr. Paula Libra, MD  Monte Fantasia, PA-C 11/04/13 [phone]

## 2013-11-03 NOTE — ED Notes (Signed)
Patient transported to X-ray 

## 2013-11-03 NOTE — ED Notes (Signed)
Pt arrived to the ED with a complaint of abdominal pain.  Pt's ain is located right side both upper and lower.  Pt denies episodes of emesis.  Pt was nauseated but it has since gone away after he took a muscle relaxer.

## 2013-11-04 MED ORDER — IBUPROFEN 800 MG PO TABS: 800.0000 mg | ORAL_TABLET | Freq: Three times a day (TID) | ORAL | Status: DC

## 2013-11-04 NOTE — Discharge Instructions (Signed)
Chest Wall Pain °Chest wall pain is pain in or around the bones and muscles of your chest. It may take up to 6 weeks to get better. It may take longer if you must stay physically active in your work and activities.  °CAUSES  °Chest wall pain may happen on its own. However, it may be caused by: °· A viral illness like the flu. °· Injury. °· Coughing. °· Exercise. °· Arthritis. °· Fibromyalgia. °· Shingles. °HOME CARE INSTRUCTIONS  °· Avoid overtiring physical activity. Try not to strain or perform activities that cause pain. This includes any activities using your chest or your abdominal and side muscles, especially if heavy weights are used. °· Put ice on the sore area. °¨ Put ice in a plastic bag. °¨ Place a towel between your skin and the bag. °¨ Leave the ice on for 15-20 minutes per hour while awake for the first 2 days. °· Only take over-the-counter or prescription medicines for pain, discomfort, or fever as directed by your caregiver. °SEEK IMMEDIATE MEDICAL CARE IF:  °· Your pain increases, or you are very uncomfortable. °· You have a fever. °· Your chest pain becomes worse. °· You have new, unexplained symptoms. °· You have nausea or vomiting. °· You feel sweaty or lightheaded. °· You have a cough with phlegm (sputum), or you cough up blood. °MAKE SURE YOU:  °· Understand these instructions. °· Will watch your condition. °· Will get help right away if you are not doing well or get worse. °Document Released: 02/22/2005 Document Revised: 05/17/2011 Document Reviewed: 10/19/2010 °ExitCare® Patient Information ©2015 [employer]. This information is not intended to replace advice given to you by your health care provider. Make sure you discuss any questions you have with your health care provider. ° ° °Emergency Department Resource Guide °1) Find a Doctor and Pay Out of Pocket °Although you won't have to find out who is covered by your insurance plan, it is a good idea to ask around and get recommendations. You  will then need to call the office and see if the doctor you have chosen will accept you as a new patient and what types of options they offer for patients who are self-pay. Some doctors offer discounts or will set up payment plans for their patients who do not have insurance, but you will need to ask so you aren't surprised when you get to your appointment. ° °2) Contact Your Local Health Department °Not all health departments have doctors that can see patients for sick visits, but many do, so it is worth a call to see if yours does. If you don't know where your local health department is, you can check in your phone book. The CDC also has a tool to help you locate your state's health department, and many state websites also have listings of all of their local health departments. ° °3) Find a Walk-in Clinic °If your illness is not likely to be very severe or complicated, you may want to try a walk in clinic. These are popping up all over the country in pharmacies, drugstores, and shopping centers. They're usually staffed by nurse practitioners or physician assistants that have been trained to treat common illnesses and complaints. They're usually fairly quick and inexpensive. However, if you have serious medical issues or chronic medical problems, these are probably not your best option. ° °No Primary Care Doctor: °- Call [facility] at  832-8000 - they can help you locate a primary care doctor that    accepts your insurance, provides certain services, etc. °- Physician Referral Service- 1-800-533-3463 ° °Chronic Pain Problems: °Organization         Address  Phone   Notes  °[facility]  (336) 297-2271 Patients need to be referred by their primary care doctor.  ° °Medication Assistance: °Organization         Address  Phone   Notes  °[place] [facility] [address] °[place], [place] [address] (336) 641-8030 --Must be a resident of [place] °-- Must  have NO insurance coverage whatsoever (no Medicaid/ Medicare, etc.) °-- The pt. MUST have a primary care doctor that directs their care regularly and follows them in the community °  °[facility]  (866) 331-1348   °[employer]  (888) 892-1162   ° °Agencies that provide inexpensive medical care: °Organization         Address  Phone   Notes  °[facility]  (336) 832-8035   °[facility]    (336) 832-7272   °[facility] [address] °[place], [place] [address] (336) 832-4777   °[facility] [address], °[place] (336) 271-4999   °[facility]    (336) 373-0678   °[facility]    (336) 272-1050   °[facility] ° [address], [place] Phone:  (336) 832-4444, Fax:  (336) 832-4440 Hours of Operation:  9 am - 6 pm, M-F.  Also accepts Medicaid/Medicare and self-pay.  °[facility] ° [address], [place] Phone: (336) 832-3150, Fax: (336) 832-3151. Hours of Operation:  8:30 am - 5:30 pm, M-F.  Also accepts Medicaid and self-pay.  °[facility] [place] [address], [place] Phone: (336) 878-6027   °[facility] [address], [place], [place] (336)723-1848, Ext. 123 Mondays & Thursdays: 7-9 AM.  First 15 patients are seen on a first come, first serve basis. °  ° °Medicaid-accepting [place] Providers: ° °Organization         Address  Phone   Notes  °[facility] [address], [place] (336) 641-2100 Also accepts self-pay patients.  °[facility] [address], [place] ° (336) 856-9996   °[facility] [address], [place] (336) 288-8857   °[facility] [address] [place] [address], [place] (336) 299-7000   °Veita Bland [address], [place]  ° (336) 373-1557 Only accepts [place] Access Medicaid patients after  they have their name applied to their card.  ° °Self-Pay (no insurance) in [place]: ° °Organization         Address  Phone   Notes  °Sickle Cell Patients, [facility] [address], [place] (336) 832-1970   °[facility] [address], [place] (336) 832-4400   °[facility] ° [address], [place] (336) 992-4800   °[facility]/Dr. Osei-Bonsu ° [address] [place] [address], [place] or [address], [place] (336) 841-8500 Phone number for both [place] and [place] locations is the same.  °[facility] [address], [place] (336) 299-0000   °[facility] [address] [place] [address], [place] or [address] (336) 852-7530 °(336) 878-2260   °[facility] [address], [place] (336) 350-1642, phone; (336) 294-5005, fax Sees patients 1st and 3rd Saturday of every month.  Must not qualify for public or private insurance (i.e. Medicaid, Medicare, [place] Health Choice, Veterans' Benefits) • Household income should be no more than 200% of the poverty level •The clinic cannot treat you if you are pregnant or think you are pregnant • Sexually transmitted diseases are not treated at the clinic.  ° ° °Dental Care: °Organization         Address  Phone  Notes  °[facility] [address], [place] (336) 641-6152 Accepts children up to age 21 who are enrolled in Medicaid or [place] Health Choice; pregnant women with a Medicaid card; and children who have applied for Medicaid or [place] Health Choice, but were declined, whose parents can pay a reduced fee at time of service.  °[facility] [place]  [address], [place] (336) 641-7733 Accepts children up to age 21 who are enrolled in Medicaid or [place] Health Choice; pregnant women with a Medicaid card; and children who  have applied for Medicaid or [place] Health Choice, but were declined, whose parents can pay a reduced fee at time of service.  °[facility] ° [address], [place] (336) 641-4533 Patients are seen by appointment only. Walk-ins are not accepted. [facility] will see patients 18 years of age and older. °Monday - Tuesday (8am-5pm) °Most Wednesdays (8:30-5pm) °$30 per visit, cash only  °[facility] ° [address], [place] (336) 641-4533 Patients are seen by appointment only. Walk-ins are not accepted. [facility] will see patients 18 years of age and older. °One Wednesday Evening (Monthly: Volunteer Based).  $30 per visit, cash only  °[facility]  (919) 537-3737 for adults; Children under age 4, call Graduate Pediatric Dentistry at (919) [phone]. Children aged 4-14, please call (919) [phone] to request a pediatric application. ° Dental services are provided in all areas of dental care including fillings, crowns and bridges, complete and partial dentures, implants, gum treatment, root canals, and extractions. Preventive care is also provided. Treatment is provided to both adults and children. °Patients are selected via a lottery and there is often a waiting list. °  °[facility] [address], °[place] ° (336) 763-8833 [email or web address] °  °[facility] [address], [place], [place] (336)723-1848, Ext. 123 Second and Fourth Thursday of each month, opens at 6:30 AM; Clinic ends at 9 AM.  Patients are seen on a first-come first-served basis, and a limited number are seen during each clinic.  ° °[facility] ° [address], [place], [place] (336) 723-7904   Eligibility Requirements °You must have lived in [place], [place], or [place] counties for at least the last three months. °  You cannot be eligible for state or federal sponsored healthcare insurance, including Veterans  Administration, Medicaid, or Medicare. °  You generally cannot be eligible for healthcare insurance through your employer.  °  How to apply: °Eligibility screenings are held every Tuesday and Wednesday afternoon from 1:00 pm until 4:00 pm. You do not need an appointment for the interview!  °[facility] [address], [place], [place] 336-631-2330   °[facility]  336-342-8273   °[facility]  336-703-3100   °[facility]    336-570-6415   ° °Behavioral Health Resources in the Community: °Intensive Outpatient Programs °Organization         Address  Phone  Notes  °[facility] [address], [place], [place] 336-878-6098   °[facility] [address], [place], [place] 336-832-9800   °[facility] [address], [place], [place] ° 336-882-2125   °[facility] [address],  °[place], [place] 1-800-853-5163 or 336-641-4981   °Substance Abuse Resources °Organization         Address  Phone  Notes  °[facility]  336-882-2125   °[facility]  336-784-9470   °[facility]  336-285-9073   °[facility]  336-845-3988   °Residential & Outpatient Substance Abuse Program  1-800-659-3381   °Psychological Services °Organization         Address  Phone  Notes  °[facility]  336- 832-9600   °[facility]  336- 378-7881   °[facility] [address], [place] 1-800-853-5163 or 336-641-4981   ° °Mobile Crisis Teams °Organization         Address  Phone  Notes  °[facility], Mobile Crisis Care Unit  1-877-626-1772   °Assertive °[facility] ° [address] [place], [place] 336-834-9664   °Sharon DeEsch [address] °[place] 336-554-5454   ° °Self-Help/Support Groups °Organization         Address  Phone             Notes  °[facility] -  variety of support groups  336- 373-1402 Call for more information  °Narcotics Anonymous (NA), [facility] [address], °[place]  2 meetings at this location  ° °Residential Treatment Programs °Organization         Address  Phone  Notes  °[facility] [address],    °[place]  1-866-801-8205   °[facility] ° [address], [place], [place] 704-293-8524   °[facility] [address], [place] 336-845-3988 Admissions: 8am-3pm M-F  °[facility] [address]    °[place], [place] 336-841-1104   °[facility] [address], [place], [place] 336-379-7146   °[facility] [address]  °[place], [place] 336-285-9073   °[facility] Intensive Outpatient [address], [place], [place] 336-852-3033   °[facility] [address]  °[place], [place] 1-877-615-2722 or 336-784-9470   °[facility] [address] [place], [place] 336-227-7417 Accepts Medicaid  °[facility] [address]  °[place] 1-800-659-3381 Substance Abuse/Addiction Treatment  ° °[place] Behavioral Health Resources °Organization         Address  Phone  Notes  °[facility]  (888) 581-9988   °Julie Brannon, PhD [address] [place], [place]   (336) 349-5553 or (336) 951-0000   °[facility]   [address] °[place], [place] (336) 349-4454   °[facility] [address], [place], [place] (336) 342-8316 Insurance/Medicaid/sponsorship through [facility]  °[facility] [address]                                    [place],  (336) 342-8316 Therapy/tele-psych/case  °[facility] [address]  ° [place], [place] (336) 349-2233    °Dr. Arfeen  (336) 349-4544   °[facility] [place]  [employer] [place] [facility] 1) [address], [place] °2) [address], [place] °3)  [address], [place] (336) 349-3220 °(336) 342-7768 ° °(336) 342-8140   °[place] [facility] (336) 342-1394 or (336) 342-3537 (After Hours)    ° ° ° °

## 2013-11-04 NOTE — ED Provider Notes (Signed)
Medical screening examination/treatment/procedure(s) were performed by non-physician practitioner and as supervising physician I was immediately available for consultation/collaboration.   EKG Interpretation None        Lakeysha Slutsky L Yazmen Briones, MD 11/04/13 0630 

## 2013-11-04 NOTE — ED Notes (Signed)
Patient is alert and oriented x3.  He was given DC instructions and follow up visit instructions.  Patient gave verbal understanding.  He was DC ambulatory under his own power to home.  V/S stable.  He was not showing any signs of distress on DC 

## 2014-01-18 ENCOUNTER — Encounter (HOSPITAL_COMMUNITY): Payer: Self-pay | Admitting: *Deleted

## 2014-01-18 ENCOUNTER — Emergency Department (HOSPITAL_COMMUNITY): Payer: Worker's Compensation

## 2014-01-18 ENCOUNTER — Ambulatory Visit (HOSPITAL_COMMUNITY)
Admission: EM | Admit: 2014-01-18 | Discharge: 2014-01-19 | Disposition: A | Discharge status: Home / Self Care | Payer: Worker's Compensation | Attending: Emergency Medicine | Admitting: Emergency Medicine

## 2014-01-18 DIAGNOSIS — Y929 Unspecified place or not applicable: Secondary | ICD-10-CM | POA: Insufficient documentation

## 2014-01-18 DIAGNOSIS — Z87891 Personal history of nicotine dependence: Secondary | ICD-10-CM | POA: Diagnosis not present

## 2014-01-18 DIAGNOSIS — W312XXA Contact with powered woodworking and forming machines, initial encounter: Secondary | ICD-10-CM | POA: Diagnosis not present

## 2014-01-18 DIAGNOSIS — S66922A Laceration of unspecified muscle, fascia and tendon at wrist and hand level, left hand, initial encounter: Secondary | ICD-10-CM | POA: Diagnosis present

## 2014-01-18 DIAGNOSIS — K279 Peptic ulcer, site unspecified, unspecified as acute or chronic, without hemorrhage or perforation: Secondary | ICD-10-CM | POA: Insufficient documentation

## 2014-01-18 DIAGNOSIS — S66222A Laceration of extensor muscle, fascia and tendon of left thumb at wrist and hand level, initial encounter: Secondary | ICD-10-CM | POA: Diagnosis not present

## 2014-01-18 DIAGNOSIS — S61219A Laceration without foreign body of unspecified finger without damage to nail, initial encounter: Secondary | ICD-10-CM

## 2014-01-18 DIAGNOSIS — S62633B Displaced fracture of distal phalanx of left middle finger, initial encounter for open fracture: Secondary | ICD-10-CM

## 2014-01-18 DIAGNOSIS — IMO0002 Reserved for concepts with insufficient information to code with codable children: Secondary | ICD-10-CM

## 2014-01-18 DIAGNOSIS — S61313A Laceration without foreign body of left middle finger with damage to nail, initial encounter: Secondary | ICD-10-CM | POA: Diagnosis not present

## 2014-01-18 LAB — BASIC METABOLIC PANEL
Anion gap: 17 — ABNORMAL HIGH (ref 5–15)
BUN: 15 mg/dL (ref 6–23)
CALCIUM: 8.9 mg/dL (ref 8.4–10.5)
CO2: 22 meq/L (ref 19–32)
CREATININE: 0.96 mg/dL (ref 0.50–1.35)
Chloride: 100 mEq/L (ref 96–112)
GFR calc Af Amer: >90 mL/min (ref >90)
GFR calc non Af Amer: >90 mL/min (ref >90)
GLUCOSE: 93 mg/dL (ref 70–99)
Potassium: 4.2 mEq/L (ref 3.7–5.3)
Sodium: 139 mEq/L (ref 137–147)

## 2014-01-18 LAB — CBC
HEMATOCRIT: 43.4 % (ref 39.0–52.0)
Hemoglobin: 14.7 g/dL (ref 13.0–17.0)
MCH: 31.1 pg (ref 26.0–34.0)
MCHC: 33.9 g/dL (ref 30.0–36.0)
MCV: 91.9 fL (ref 78.0–100.0)
Platelets: 282 10*3/uL (ref 150–400)
RBC: 4.72 MIL/uL (ref 4.22–5.81)
RDW: 12.6 % (ref 11.5–15.5)
WBC: 10.9 10*3/uL — ABNORMAL HIGH (ref 4.0–10.5)

## 2014-01-18 LAB — ABO/RH: ABO/RH(D): A POS

## 2014-01-18 LAB — TYPE AND SCREEN
ABO/RH(D): A POS
Antibody Screen: NEGATIVE

## 2014-01-18 MED ORDER — SODIUM CHLORIDE 0.9 % IV SOLN: Freq: Once | INTRAVENOUS | Status: DC

## 2014-01-18 MED ORDER — HYDROMORPHONE HCL 1 MG/ML IJ SOLN
1.0000 mg | Freq: Once | INTRAMUSCULAR | Status: AC
  Administered 2014-01-18: 1 mg via INTRAVENOUS
  Filled 2014-01-18: qty 1

## 2014-01-18 MED ORDER — TETANUS-DIPHTH-ACELL PERTUSSIS 5-2.5-18.5 LF-MCG/0.5 IM SUSP
0.5000 mL | Freq: Once | INTRAMUSCULAR | Status: AC
  Administered 2014-01-18: 0.5 mL via INTRAMUSCULAR

## 2014-01-18 MED ORDER — FENTANYL CITRATE 0.05 MG/ML IJ SOLN
INTRAMUSCULAR | Status: AC
  Filled 2014-01-18: qty 2

## 2014-01-18 MED ORDER — CEFAZOLIN SODIUM-DEXTROSE 2-3 GM-% IV SOLR
2.0000 g | Freq: Once | INTRAVENOUS | Status: AC
  Administered 2014-01-18: 2 g via INTRAVENOUS

## 2014-01-18 MED ORDER — TETANUS-DIPHTH-ACELL PERTUSSIS 5-2.5-18.5 LF-MCG/0.5 IM SUSP
INTRAMUSCULAR | Status: AC
  Filled 2014-01-18: qty 0.5

## 2014-01-18 MED ORDER — FENTANYL CITRATE 0.05 MG/ML IJ SOLN
50.0000 ug | Freq: Once | INTRAMUSCULAR | Status: AC
  Administered 2014-01-18: 50 ug via INTRAVENOUS

## 2014-01-18 MED ORDER — ONDANSETRON HCL 4 MG/2ML IJ SOLN
INTRAMUSCULAR | Status: AC
  Filled 2014-01-18: qty 2

## 2014-01-18 MED ORDER — ONDANSETRON HCL 4 MG/2ML IJ SOLN
4.0000 mg | Freq: Once | INTRAMUSCULAR | Status: AC
  Administered 2014-01-18: 4 mg via INTRAVENOUS

## 2014-01-18 MED ORDER — CEFAZOLIN SODIUM-DEXTROSE 2-3 GM-% IV SOLR
INTRAVENOUS | Status: AC
  Filled 2014-01-18: qty 50

## 2014-01-18 NOTE — ED Notes (Signed)
The pt struck his lt hand against a sharp blade while cutting wood at work.  Laceration to his index and middle fingers.  Bleeding controlled   Tourniquet type bandage placed at work released on arrival to the room

## 2014-01-18 NOTE — ED Provider Notes (Signed)
Patient suffered multiple lacerations to left hand using a power saw immediate prior to coming here. No other injury. Past mental history peptic ulcer disease. Last by mouth intake 6 PM tonight.  Doug SouSam Michaiah Holsopple, MD 01/18/14 2317

## 2014-01-18 NOTE — Anesthesia Preprocedure Evaluation (Signed)
Anesthesia Evaluation  Patient identified by MRN, date of birth, ID band Patient awake    Reviewed: Allergy & Precautions, H&P , NPO status , Patient's Chart, lab work & pertinent test results  History of Anesthesia Complications Negative for: history of anesthetic complications  Airway Mallampati: II  TM Distance: >3 FB Neck ROM: Full    Dental  (+) Missing   Pulmonary neg sleep apnea, neg COPDneg recent URI, former smoker,  breath sounds clear to auscultation        Cardiovascular negative cardio ROS  Rhythm:Regular     Neuro/Psych negative neurological ROS     GI/Hepatic Neg liver ROS, PUD,   Endo/Other  negative endocrine ROS  Renal/GU negative Renal ROS     Musculoskeletal Left hand laceration   Abdominal   Peds  Hematology negative hematology ROS (+)   Anesthesia Other Findings   Reproductive/Obstetrics                             Anesthesia Physical Anesthesia Plan  ASA: II  Anesthesia Plan: General   Post-op Pain Management:    Induction: Intravenous, Cricoid pressure planned and Rapid sequence  Airway Management Planned: Oral ETT  Additional Equipment: None  Intra-op Plan:   Post-operative Plan: Extubation in OR  Informed Consent: I have reviewed the patients History and Physical, chart, labs and discussed the procedure including the risks, benefits and alternatives for the proposed anesthesia with the patient or authorized representative who has indicated his/her understanding and acceptance.   Dental advisory given  Plan Discussed with: CRNA and Surgeon  Anesthesia Plan Comments:         Anesthesia Quick Evaluation

## 2014-01-18 NOTE — ED Notes (Signed)
To the or

## 2014-01-18 NOTE — ED Provider Notes (Signed)
CSN: [id number]     Arrival date & time 01/18/14  2236 History   First MD Initiated Contact with Patient 01/18/14 2242     Chief Complaint  Patient presents with  . Laceration     (Consider location/radiation/quality/duration/timing/severity/associated sxs/prior Treatment) Patient is a 28 y.o. male presenting with hand injury. The history is provided by the patient. No language interpreter was used.  Hand Injury Location:  Finger Injury: yes   Mechanism of injury: amputation   Amputation:    Extent:  Partial   Mechanism:  Avulsion   Cause: power saw.   Body part recovered: yes     Reported condition of body part:  Semi-intact Finger location:  L middle finger and L thumb Pain details:    Quality:  Sharp   Radiates to:  Does not radiate   Severity:  Severe   Onset quality:  Sudden   Timing:  Constant   Progression:  Worsening Chronicity:  New Handedness:  Left-handed Dislocation: no   Foreign body present:  No foreign bodies Tetanus status:  Unknown Prior injury to area:  No Relieved by:  Immobilization and being still Worsened by:  Bearing weight Ineffective treatments:  None tried Associated symptoms: no decreased range of motion, no fever, no numbness, no swelling and no tingling   Risk factors: no frequent fractures and no recent illness     Past Medical History  Diagnosis Date  . Peptic ulcer disease   . Abdominal pain    Past Surgical History  Procedure Laterality Date  . Cholecystectomy N/A 08/20/2012    Procedure: LAPAROSCOPIC CHOLECYSTECTOMY WITH INTRAOPERATIVE CHOLANGIOGRAM;  Surgeon: Valarie Merino, MD;  Location: [facility];  Service: General;  Laterality: N/A;   No family history on file. History  Substance Use Topics  . Smoking status: Former Games developer  . Smokeless tobacco: Not on file  . Alcohol Use: Yes     Comment: occasional    Review of Systems  Constitutional: Negative for fever.  HENT: Negative for congestion, rhinorrhea and sore throat.    Respiratory: Negative for cough and shortness of breath.   Cardiovascular: Negative for chest pain.  Gastrointestinal: Negative for nausea, vomiting, abdominal pain and diarrhea.  Genitourinary: Negative for dysuria and hematuria.  Skin: Positive for wound (partial amputation of left middle finger and exposed tendon of left thumb). Negative for rash.  Neurological: Negative for syncope, light-headedness and headaches.  All other systems reviewed and are negative.     Allergies  Review of patient's allergies indicates no known allergies.  Home Medications   Prior to Admission medications   Medication Sig Start Date End Date Taking? Authorizing Provider  ibuprofen (ADVIL,MOTRIN) 600 MG tablet Take 600 mg by mouth every 6 (six) hours as needed for mild pain.    Historical Provider, MD  ibuprofen (ADVIL,MOTRIN) 800 MG tablet Take 1 tablet (800 mg total) by mouth 3 (three) times daily. 11/04/13   Monte Fantasia, PA-C  methocarbamol (ROBAXIN) 500 MG tablet Take 1,000 mg by mouth 4 (four) times daily.    Historical Provider, MD   BP 130/85 mmHg  Pulse 82  Resp 13  SpO2 95% Physical Exam  Constitutional: He is oriented to person, place, and time. He appears well-developed and well-nourished.  HENT:  Head: Normocephalic and atraumatic.  Right Ear: External ear normal.  Left Ear: External ear normal.  Eyes: EOM are normal.  Neck: Normal range of motion. Neck supple.  Cardiovascular: Normal rate, regular rhythm and intact distal pulses.  Exam reveals no gallop and no friction rub.   No murmur heard. Pulmonary/Chest: Effort normal and breath sounds normal. No respiratory distress. He has no wheezes. He has no rales. He exhibits no tenderness.  Abdominal: Soft. Bowel sounds are normal. He exhibits no distension. There is no tenderness. There is no rebound.  Musculoskeletal: Normal range of motion. He exhibits tenderness. He exhibits no edema.  Left Upper Extremity INSPECTION: large  laceration over dorsal aspect of thumb with exposed tendons. Partial amputation of distal middle finger PALPATION: Tender to palpation over thumb and distal middle finger ROM: Good to normal AROM and PROM in wrist, elbow and shoulder joint. VASCULAR: Extremity warm and well-perfused. 2+ radial SENSORY: sensation is intact to light touch in superficial radial (dorsal first web space), median (tip of index finger), ulnar (tip of small finger) nerve distributions. MOTOR:+motor posterior interosseous nerve (thumb IP extension), anterior interosseous nerve (thumb IP flexion, index finger DIP flexion), radial nerve (wrist extension), median nerve (palpable firing thenar mass), ulnar nerve (palpable firing of first dorsal interosseous muscle). 2 point discrimination  Lymphadenopathy:    He has no cervical adenopathy.  Neurological: He is alert and oriented to person, place, and time.  Skin: Skin is warm. No rash noted.  Psychiatric: He has a normal mood and affect. His behavior is normal.  Nursing note and vitals reviewed.   ED Course  Procedures (including critical care time) Labs Review Labs Reviewed  CBC - Abnormal; Notable for the following:    WBC 10.9 (*)    All other components within normal limits  BASIC METABOLIC PANEL  TYPE AND SCREEN    Imaging Review Dg Hand 2 View Left  01/18/2014   CLINICAL DATA:  LEFT hand laceration with table saw.  EXAM: LEFT HAND - 2 VIEW  COMPARISON:  None.  FINDINGS: Highly comminuted displaced fracture of the third tuft, without intra-articular extension. Medial deviation of distal bony fragments. No dislocation. No destructive bony lesions. Third digit distal aspect soft tissue regular most consistent with laceration open fracture.  IMPRESSION: Third digit distal phalanx open displaced fracture without dislocation.   Electronically Signed   By: Awilda Metroourtnay  Bloomer   On: 01/18/2014 23:12     EKG Interpretation None      MDM   Final diagnoses:  Open  displaced fracture of distal phalanx of left middle finger, initial encounter  Laceration of finger of left hand with tendon involvement, initial encounter    10:51 PM Pt is a 28 y.o. male with pertinent PMHX of Peptic ulcer disease, left handed who presents to the ED with amputation of distal middle finger, lacerations to thumb with exposed tendons. Last tetanus unknown. Some mild tingling and numbness. No LOC or amnesia to event. Last PO intake 6PM. No illicit drug abuse. Fell back. No other injuries. Not on blood thinners.  Open exposed tendons of the dorsal thumb. NVI, 2 point discrimination intact. Distal partial amputation of 3rd finger with nail bed injury. Concern for possible open fracture. Will start Ancef in the ED. Will also obtain screening labs  Plan to obtain XR left hand, update tetanus. Start ancef. Will give fentanyl for pain and zofran for nausea  XR left hand showed displaced open fracture or distal phalanx of middle finger.  Review of labs: BMP: no electrolyte abnormalities CBC: leukocytosis, H&H 14.7/43.4  Pain not improved with fentanyl, plan for dilaudid 1 mg.  Discussed with orthopedics hand: Dr. Izora Ribasoley: plan to go to OR  Imaging reviewed by myself  and considered in medical decision making if ordered.  Imaging interpreted by radiology. Pt was discussed with my attending, Dr. Elmer SowJacubowitz   Carolyn Sylvia Peter Yaret Hush, MD 01/18/14 2340  Doug SouSam Jacubowitz, MD 01/19/14 [id number]

## 2014-01-19 ENCOUNTER — Encounter (HOSPITAL_COMMUNITY)
Admission: EM | Disposition: A | Discharge status: Home / Self Care | Payer: Self-pay | Source: Home / Self Care | Attending: Emergency Medicine

## 2014-01-19 ENCOUNTER — Emergency Department (HOSPITAL_COMMUNITY): Payer: Worker's Compensation | Admitting: Certified Registered Nurse Anesthetist

## 2014-01-19 DIAGNOSIS — K279 Peptic ulcer, site unspecified, unspecified as acute or chronic, without hemorrhage or perforation: Secondary | ICD-10-CM | POA: Diagnosis not present

## 2014-01-19 DIAGNOSIS — S66222A Laceration of extensor muscle, fascia and tendon of left thumb at wrist and hand level, initial encounter: Secondary | ICD-10-CM | POA: Diagnosis not present

## 2014-01-19 DIAGNOSIS — Z87891 Personal history of nicotine dependence: Secondary | ICD-10-CM | POA: Diagnosis not present

## 2014-01-19 DIAGNOSIS — S61313A Laceration without foreign body of left middle finger with damage to nail, initial encounter: Secondary | ICD-10-CM | POA: Diagnosis not present

## 2014-01-19 HISTORY — PX: TENDON REPAIR: SHX5111

## 2014-01-19 HISTORY — PX: LACERATION REPAIR: SHX5284

## 2014-01-19 HISTORY — PX: OPEN REDUCTION INTERNAL FIXATION (ORIF) DISTAL PHALANX: SHX6236

## 2014-01-19 SURGERY — OPEN REDUCTION INTERNAL FIXATION (ORIF) DISTAL PHALANX
Anesthesia: General | Site: Finger | Laterality: Left

## 2014-01-19 MED ORDER — ONDANSETRON HCL 4 MG/2ML IJ SOLN
INTRAMUSCULAR | Status: DC | PRN
  Administered 2014-01-19: 4 mg via INTRAVENOUS

## 2014-01-19 MED ORDER — DEXAMETHASONE SODIUM PHOSPHATE 4 MG/ML IJ SOLN
INTRAMUSCULAR | Status: DC | PRN
  Administered 2014-01-19: 4 mg via INTRAVENOUS

## 2014-01-19 MED ORDER — FENTANYL CITRATE 0.05 MG/ML IJ SOLN
INTRAMUSCULAR | Status: DC | PRN
  Administered 2014-01-19: 100 ug via INTRAVENOUS

## 2014-01-19 MED ORDER — PROPOFOL 10 MG/ML IV BOLUS
INTRAVENOUS | Status: AC
  Filled 2014-01-19: qty 20

## 2014-01-19 MED ORDER — SODIUM CHLORIDE 0.9 % IV SOLN
INTRAVENOUS | Status: DC | PRN
  Administered 2014-01-19: via INTRAVENOUS

## 2014-01-19 MED ORDER — PROPOFOL 10 MG/ML IV BOLUS
INTRAVENOUS | Status: DC | PRN
  Administered 2014-01-19: 50 mg via INTRAVENOUS
  Administered 2014-01-19: 200 mg via INTRAVENOUS
  Administered 2014-01-19: 50 mg via INTRAVENOUS

## 2014-01-19 MED ORDER — LIDOCAINE HCL (CARDIAC) 20 MG/ML IV SOLN
INTRAVENOUS | Status: DC | PRN
  Administered 2014-01-19: 70 mg via INTRAVENOUS

## 2014-01-19 MED ORDER — 0.9 % SODIUM CHLORIDE (POUR BTL) OPTIME
TOPICAL | Status: DC | PRN
  Administered 2014-01-19: 1000 mL

## 2014-01-19 MED ORDER — BUPIVACAINE HCL (PF) 0.25 % IJ SOLN
INTRAMUSCULAR | Status: AC
  Filled 2014-01-19: qty 30

## 2014-01-19 MED ORDER — BUPIVACAINE HCL (PF) 0.25 % IJ SOLN
INTRAMUSCULAR | Status: DC | PRN
  Administered 2014-01-19: 20 mL

## 2014-01-19 MED ORDER — LACTATED RINGERS IV SOLN
INTRAVENOUS | Status: DC | PRN
  Administered 2014-01-19: 01:00:00 via INTRAVENOUS

## 2014-01-19 MED ORDER — MIDAZOLAM HCL 2 MG/2ML IJ SOLN
INTRAMUSCULAR | Status: AC
  Filled 2014-01-19: qty 2

## 2014-01-19 MED ORDER — MIDAZOLAM HCL 5 MG/5ML IJ SOLN
INTRAMUSCULAR | Status: DC | PRN
  Administered 2014-01-19: 1 mg via INTRAVENOUS

## 2014-01-19 MED ORDER — FENTANYL CITRATE 0.05 MG/ML IJ SOLN
INTRAMUSCULAR | Status: AC
  Filled 2014-01-19: qty 5

## 2014-01-19 MED ORDER — SUCCINYLCHOLINE CHLORIDE 20 MG/ML IJ SOLN
INTRAMUSCULAR | Status: DC | PRN
  Administered 2014-01-19: 40 mg via INTRAVENOUS

## 2014-01-19 SURGICAL SUPPLY — 26 items
BANDAGE ELASTIC 4 VELCRO ST LF (GAUZE/BANDAGES/DRESSINGS) ×2 IMPLANT
BNDG CMPR 9X4 STRL LF SNTH (GAUZE/BANDAGES/DRESSINGS) ×1
BNDG ESMARK 4X9 LF (GAUZE/BANDAGES/DRESSINGS) ×2 IMPLANT
CORDS BIPOLAR (ELECTRODE) ×2 IMPLANT
COVER SURGICAL LIGHT HANDLE (MISCELLANEOUS) ×2 IMPLANT
DRAPE OEC MINIVIEW 54X84 (DRAPES) ×2 IMPLANT
GAUZE XEROFORM 5X9 LF (GAUZE/BANDAGES/DRESSINGS) ×2 IMPLANT
GLOVE BIOGEL M STRL SZ7.5 (GLOVE) ×2 IMPLANT
GOWN PREVENTION PLUS LG XLONG (DISPOSABLE) ×4 IMPLANT
K-WIRE .045X6 DBL TRO NS (WIRE) ×3
KIT BASIN OR (CUSTOM PROCEDURE TRAY) ×2 IMPLANT
KWIRE .045X6 DBL TRO NS (WIRE) IMPLANT
NDL HYPO 25GX1X1/2 BEV (NEEDLE) IMPLANT
NEEDLE HYPO 25GX1X1/2 BEV (NEEDLE) ×3 IMPLANT
PACK ORTHO EXTREMITY (CUSTOM PROCEDURE TRAY) ×2 IMPLANT
PAD CAST 4YDX4 CTTN HI CHSV (CAST SUPPLIES) IMPLANT
PADDING CAST COTTON 4X4 STRL (CAST SUPPLIES) ×3
SPONGE GAUZE 4X4 12PLY STER LF (GAUZE/BANDAGES/DRESSINGS) ×2 IMPLANT
SUT CHROMIC 5 0 P 3 (SUTURE) ×2 IMPLANT
SUT ETHILON 4 0 PS 2 18 (SUTURE) ×4 IMPLANT
SUT FIBERWIRE 4-0 18 TAPR NDL (SUTURE) ×3
SUTURE FIBERWR 4-0 18 TAPR NDL (SUTURE) IMPLANT
SYR CONTROL 10ML LL (SYRINGE) ×2 IMPLANT
TOWEL NATURAL 6PK STERILE (DISPOSABLE) ×2 IMPLANT
TOWEL OR 17X26 10 PK STRL BLUE (TOWEL DISPOSABLE) ×2 IMPLANT
TUBE SUCT ARGYLE STRL (TUBING) ×2 IMPLANT

## 2014-01-19 NOTE — Anesthesia Procedure Notes (Signed)
Procedure Name: Intubation Date/Time: 01/19/2014 12:12 AM Performed by: Julianne RiceBILOTTA, Tavari Loadholt Z Pre-anesthesia Checklist: Patient identified, Timeout performed, Emergency Drugs available, Suction available and Patient being monitored Patient Re-evaluated:Patient Re-evaluated prior to inductionOxygen Delivery Method: Circle system utilized Preoxygenation: Pre-oxygenation with 100% oxygen Intubation Type: IV induction, Rapid sequence and Cricoid Pressure applied Laryngoscope Size: Mac and 3 Grade View: Grade I Tube type: Oral Tube size: 8.0 mm Number of attempts: 1 Airway Equipment and Method: Stylet Placement Confirmation: ETT inserted through vocal cords under direct vision,  breath sounds checked- equal and bilateral and positive ETCO2 Secured at: 22 cm Tube secured with: Tape Dental Injury: Teeth and Oropharynx as per pre-operative assessment

## 2014-01-19 NOTE — Discharge Instructions (Signed)
Discharge Instructions:  Keep your dressing clean, dry and in place until instructed to remove by Dr. Lijah Bourque.  If the dressing becomes dirty or wet call the office for instructions during business hours. Elevate the extremity to help with swelling, this will also help with any discomfort. Take your medication as prescribed. No lifting with the injured  extremity. If you feel that the dressing is too tight, you may loosen it, but keep it on; finger tips should be pink; if there is a concern, call the office. [phone] Ice may be used if the injury is a fracture, do not apply ice directly to the skin. Please call the office on the next business day after discharge to arrange a follow up appointment.  Call [phone] between the hours of 9am - 5pm M-Th or 9am - 1pm on Fri. For most hand injuries and/or conditions, you may return to work using the uninjured hand (one handed duty) within 24-72 hours.  A detailed note will be provided to you at your follow up appointment or may contact the office prior to your follow up.    

## 2014-01-19 NOTE — Op Note (Signed)
NAME:  Joshua Barron, Joshua Barron             ACCOUNT NO.:  [id number]  MEDICAL RECORD NO.:  [id number]  LOCATION:  [facility]                         FACILITY:  [facility]  PHYSICIAN:  Johnette AbrahamHarrill C Mayte Diers, MD    DATE OF BIRTH:  [birth year]  DATE OF PROCEDURE:  01/19/2014 DATE OF DISCHARGE:  01/19/2014                              OPERATIVE REPORT   PREOPERATIVE DIAGNOSIS:  Complex laceration, saw injury to the left hand specifically the left thumb and the left long finger and left ring finger.  POSTOPERATIVE DIAGNOSIS:  Complex laceration, saw injury to the left hand specifically the left thumb and the left long finger and left ring finger.  PROCEDURES: 1. Exploration of wounds x2 of the left thumb and left long finger. 2. Repair of extensor tendon to the left thumb. 3. Open reduction and internal fixation of the left long finger distal     phalanx. 4. Removal of nail plate of the left long finger. 5. Repair of nail bed of the left long finger. 6. Repair of complex laceration of the left long finger, measuring 2.5     cm. 7. Local flap coverage of wound defect to the left thumb.  ANESTHESIA:  General.  COMPLICATIONS:  No acute complications.  ESTIMATED BLOOD LOSS:  Minimal.  SPECIMENS:  No specimens.  INDICATIONS:  Joshua Barron is a 28 year old gentleman who lacerated his left hand this evening with some type of saw.  He presented to the emergency room with exposed tendon down the left thumb, skin loss and a nearly amputated left long finger.  The patient was evaluated.  Risks, benefits and alternatives of surgery were discussed with him including, but not limited to amputation of the left long finger.  He agreed with these risks and agreed to proceed with surgery.  Consent was obtained.  DESCRIPTION OF PROCEDURE:  The patient was taken to the operating room and placed supine on the operating room table.  General anesthesia was administered without difficulty.  The left upper extremity was  prepped and draped in normal sterile fashion.  Tourniquet was used around the left upper arm.  The arm was exsanguinated and tourniquet was inflated to 250 mmHg.  The thumb was evaluated first.  There was a large dorsal laceration and skin defect with skin loss extending from the base of the nail all the way proximal to the mid-metacarpal and then wrapped around the thenar eminence.  This wound was thoroughly irrigated.  The tendon was exposed, it was lacerated.  After thorough irrigation, the tendon was approximated with 4-0 FiberWire for nice repair.  Next, the skin circumferentially around the thumb IP joint and more proximal was mobilized to gain adequate skin coverage for the defect, this was performed and a local advancement flap was fashioned to cover the defect.  The extensor tendon and soft tissues were covered nicely. Afterwards, the left long finger was addressed.  The tourniquet was released to see if the distal tip had blood supply for fixation.  After some time, the finger became nice and pink, and therefore, reconstruction was felt possible.  The nail plate was removed.  There was a near circumferential laceration to the finger with complete transection of the  distal phalanx.  The tip was held on just by the neurovascular bundle on the ulnar side.  Skin, subcutaneous tissue, nail bed, and small pieces of bone were debrided from this wound.  This wound was thoroughly irrigated.  A 0.045 K-wire was driven in an antegrade fashion through the end of the finger.  The distal portion was matched with the proximal portion of the distal phalanx and the K-wire was driven across the DIP joint securing the distal tip of the finger.  X- ray examination under fluoroscopy revealed there was some bone loss, which was seen.  However, there was good alignment of the fragment. Following, the remainder of the wound was closed with multiple interrupted and mattress nylon sutures.  The nail bed  was tucked up underneath the eponychial fold with horizontal mattress chromic sutures. The K-wire was cut.  There was small wound on the ring finger that was gently debrided.  This wound was fairly superficial and was left open. Afterwards, sterile dressing and large bulky dressing were placed.  The patient tolerated the procedure well, and awakened from anesthesia without difficulty.     Johnette AbrahamHarrill C Laneice Meneely, MD     HCC/MEDQ  D:  01/19/2014  T:  01/19/2014  Job:  [id number]

## 2014-01-19 NOTE — H&P (Signed)
Reason for Consult:complex laceration to L hand Referring Physician: ER  CC:I cut my hand  HPI:  Joshua Barron is an 28 y.o. rihgt handed male who presents with   Complex laceration of L hand with saw     .   Pain is rated at  10  /10 and is described as sharp/dull.  Pain is constant.  Pain is made better by rest/immobilization, worse with motion.   Associated signs/symptoms:active bleeding, exposed tendons, ? Viable LF tip Previous treatment:  denies  Past Medical History  Diagnosis Date  . Peptic ulcer disease   . Abdominal pain     Past Surgical History  Procedure Laterality Date  . Cholecystectomy N/A 08/20/2012    Procedure: LAPAROSCOPIC CHOLECYSTECTOMY WITH INTRAOPERATIVE CHOLANGIOGRAM;  Surgeon: Pedro Earls, MD;  Location: [facility];  Service: General;  Laterality: N/A;    No family history on file.  Social History:  reports that he has quit smoking. He does not have any smokeless tobacco history on file. He reports that he drinks alcohol. He reports that he does not use illicit drugs.  Allergies: No Known Allergies  Medications: I have reviewed the patient's current medications.  Results for orders placed or performed during the hospital encounter of 01/18/14 (from the past 48 hour(s))  Type and screen     Status: None   Collection Time: 01/18/14 10:49 PM  Result Value Ref Range   ABO/RH(D) A POS    Antibody Screen NEG    Sample Expiration 01/21/2014   ABO/Rh     Status: None   Collection Time: 01/18/14 10:49 PM  Result Value Ref Range   ABO/RH(D) A POS   Basic metabolic panel     Status: Abnormal   Collection Time: 01/18/14 10:51 PM  Result Value Ref Range   Sodium 139 137 - 147 mEq/L   Potassium 4.2 3.7 - 5.3 mEq/L    Comment: HEMOLYSIS AT THIS LEVEL MAY AFFECT RESULT   Chloride 100 96 - 112 mEq/L   CO2 22 19 - 32 mEq/L   Glucose, Bld 93 70 - 99 mg/dL   BUN 15 6 - 23 mg/dL   Creatinine, Ser 0.96 0.50 - 1.35 mg/dL   Calcium 8.9 8.4 - 10.5 mg/dL   GFR  calc non Af Amer >90 >90 mL/min   GFR calc Af Amer >90 >90 mL/min    Comment: (NOTE) The eGFR has been calculated using the CKD EPI equation. This calculation has not been validated in all clinical situations. eGFR's persistently <90 mL/min signify possible Chronic Kidney Disease.    Anion gap 17 (H) 5 - 15  CBC     Status: Abnormal   Collection Time: 01/18/14 10:51 PM  Result Value Ref Range   WBC 10.9 (H) 4.0 - 10.5 K/uL   RBC 4.72 4.22 - 5.81 MIL/uL   Hemoglobin 14.7 13.0 - 17.0 g/dL   HCT 43.4 39.0 - 52.0 %   MCV 91.9 78.0 - 100.0 fL   MCH 31.1 26.0 - 34.0 pg   MCHC 33.9 30.0 - 36.0 g/dL   RDW 12.6 11.5 - 15.5 %   Platelets 282 150 - 400 K/uL    Dg Hand 2 View Left  01/18/2014   CLINICAL DATA:  LEFT hand laceration with table saw.  EXAM: LEFT HAND - 2 VIEW  COMPARISON:  None.  FINDINGS: Highly comminuted displaced fracture of the third tuft, without intra-articular extension. Medial deviation of distal bony fragments. No dislocation. No destructive bony  lesions. Third digit distal aspect soft tissue regular most consistent with laceration open fracture.  IMPRESSION: Third digit distal phalanx open displaced fracture without dislocation.   Electronically Signed   By: Elon Alas   On: 01/18/2014 23:12    Pertinent items are noted in HPI. Pulse Rate:  [69-94] 82 (11/13 2330) Resp:  [12-23] 13 (11/13 2330) BP: (130-137)/(84-95) 130/85 mmHg (11/13 2330) SpO2:  [95 %-97 %] 95 % (11/13 2330) General appearance: alert and cooperative Resp: clear to auscultation bilaterally Cardio: regular rate and rhythm GI: soft, non-tender; bowel sounds normal; no masses,  no organomegaly Extremities: extremities normal, atraumatic, no cyanosis or edema  Except for L hand where there is a dorsal laceration to the thumb with exposed tendons and missing soft tissue, the tip of the LLF is deformed and a complex laceration to the volar aspect is present, ? Viable tip  Assessment: Complex  laceration of L hand (thumb, LF)  Plan: Plan to proceed to OR for exploration and repair. I have discussed this treatment plan in detail with patient, including the risks of the recommended treatment or surgery, the benefits and the alternatives.  The patient  understands that additional treatment may be necessary.  Joshua Barron 01/19/2014, 12:13 AM

## 2014-01-19 NOTE — Transfer of Care (Signed)
Immediate Anesthesia Transfer of Care Note  Patient: Joshua Barron  Procedure(s) Performed: Procedure(s): OPEN REDUCTION INTERNAL FIXATION (ORIF) DISTAL PHALANX  MIDDLE FINGER (Left) TENDON REPAIR OF LEFT THUMB (Left) REPAIR  LACERATION LEFT THUMB (Left)  Patient Location: PACU  Anesthesia Type:General  Level of Consciousness: awake and patient cooperative  Airway & Oxygen Therapy: Patient Spontanous Breathing and Patient connected to face mask oxygen  Post-op Assessment: Report given to PACU RN and Post -op Vital signs reviewed and stable  Post vital signs: Reviewed and stable  Complications: No apparent anesthesia complications

## 2014-01-20 NOTE — Anesthesia Postprocedure Evaluation (Signed)
  Anesthesia Post-op Note  Patient: Joshua Barron  Procedure(s) Performed: Procedure(s): OPEN REDUCTION INTERNAL FIXATION (ORIF) DISTAL PHALANX  MIDDLE FINGER (Left) TENDON REPAIR OF LEFT THUMB (Left) REPAIR  LACERATION LEFT THUMB (Left)  Patient Location: PACU  Anesthesia Type:General  Level of Consciousness: awake  Airway and Oxygen Therapy: Patient Spontanous Breathing  Post-op Pain: none  Post-op Assessment: Post-op Vital signs reviewed, Patient's Cardiovascular Status Stable, Respiratory Function Stable, Patent Airway, No signs of Nausea or vomiting and Pain level controlled  Post-op Vital Signs: Reviewed and stable  Last Vitals:  Filed Vitals:   01/19/14 0230  BP: 121/82  Pulse: 96  Temp: 36.4 C  Resp: 21    Complications: No apparent anesthesia complications

## 2014-01-21 ENCOUNTER — Encounter (HOSPITAL_COMMUNITY): Payer: Self-pay | Admitting: General Surgery

## 2014-12-22 IMAGING — CR DG HAND 2V*L*
2 series · 2 of 2 positions shown · non-contrast
Comparison: None.

CLINICAL DATA: LEFT hand laceration with table saw.

EXAM:
LEFT HAND - 2 VIEW

[lateral]
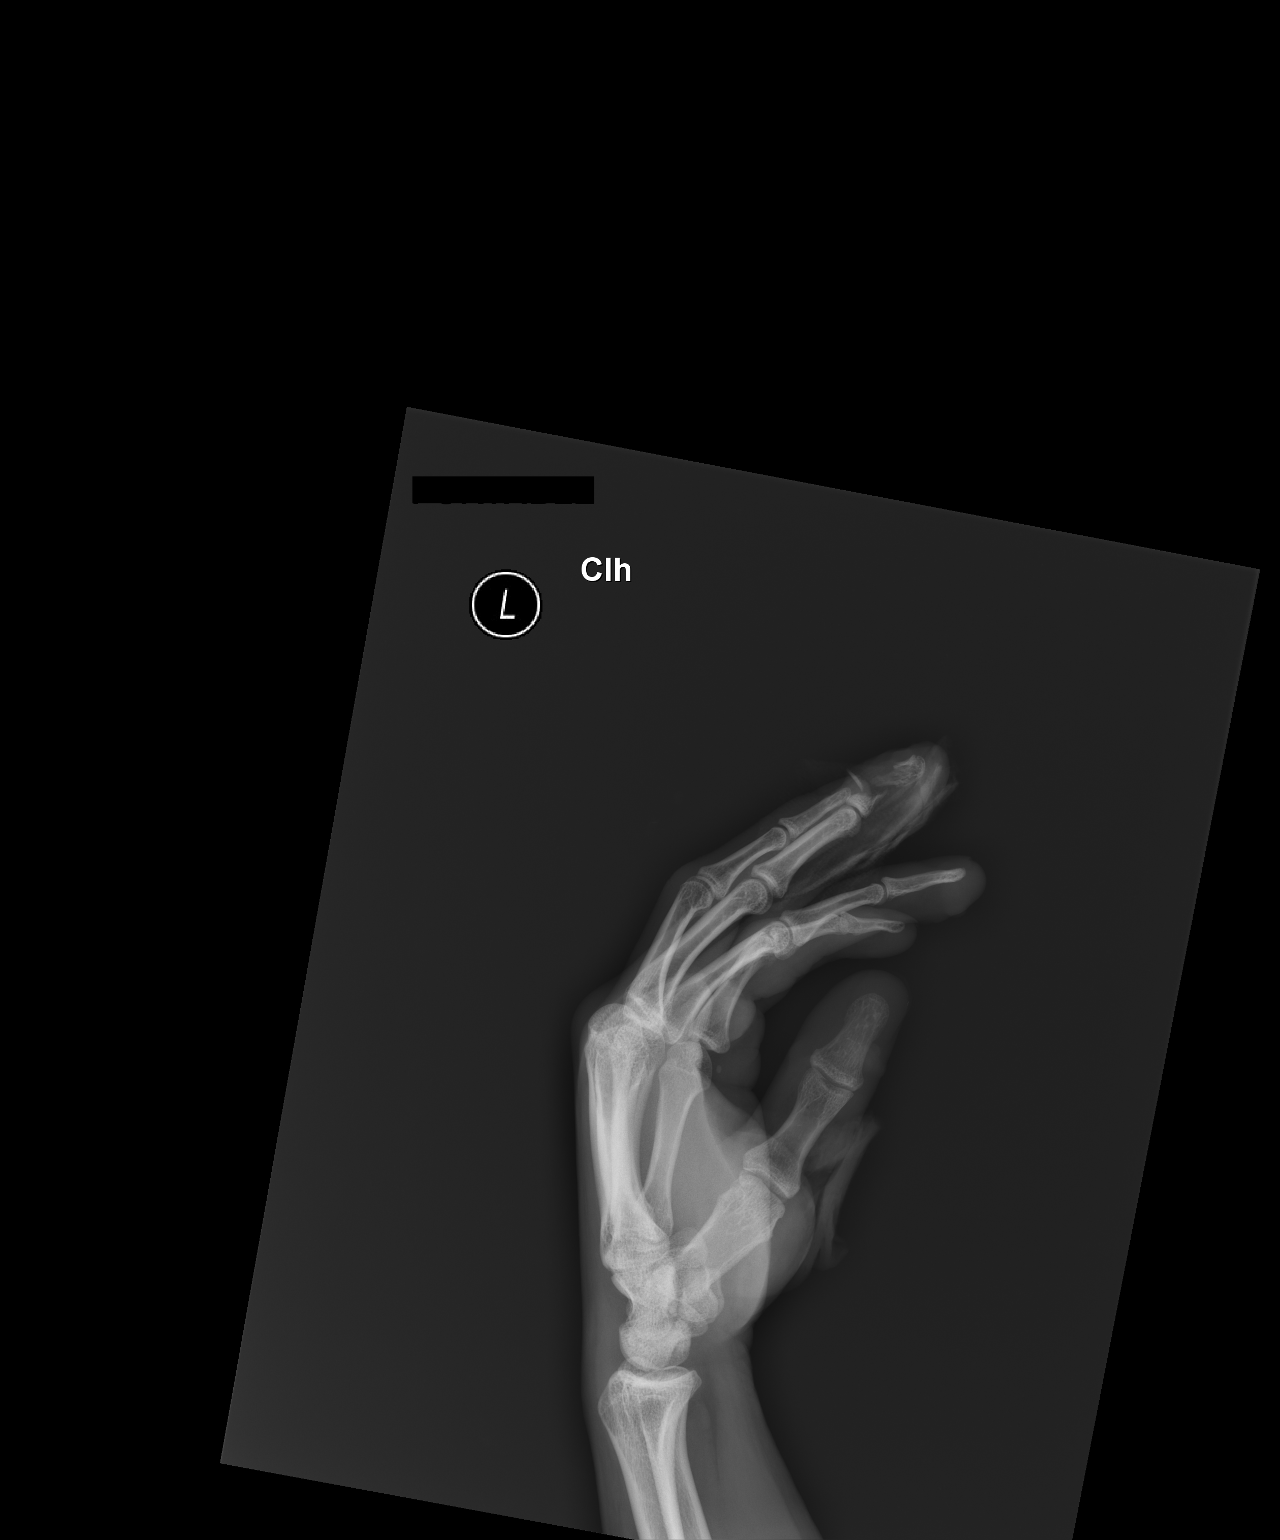

[pa obl]
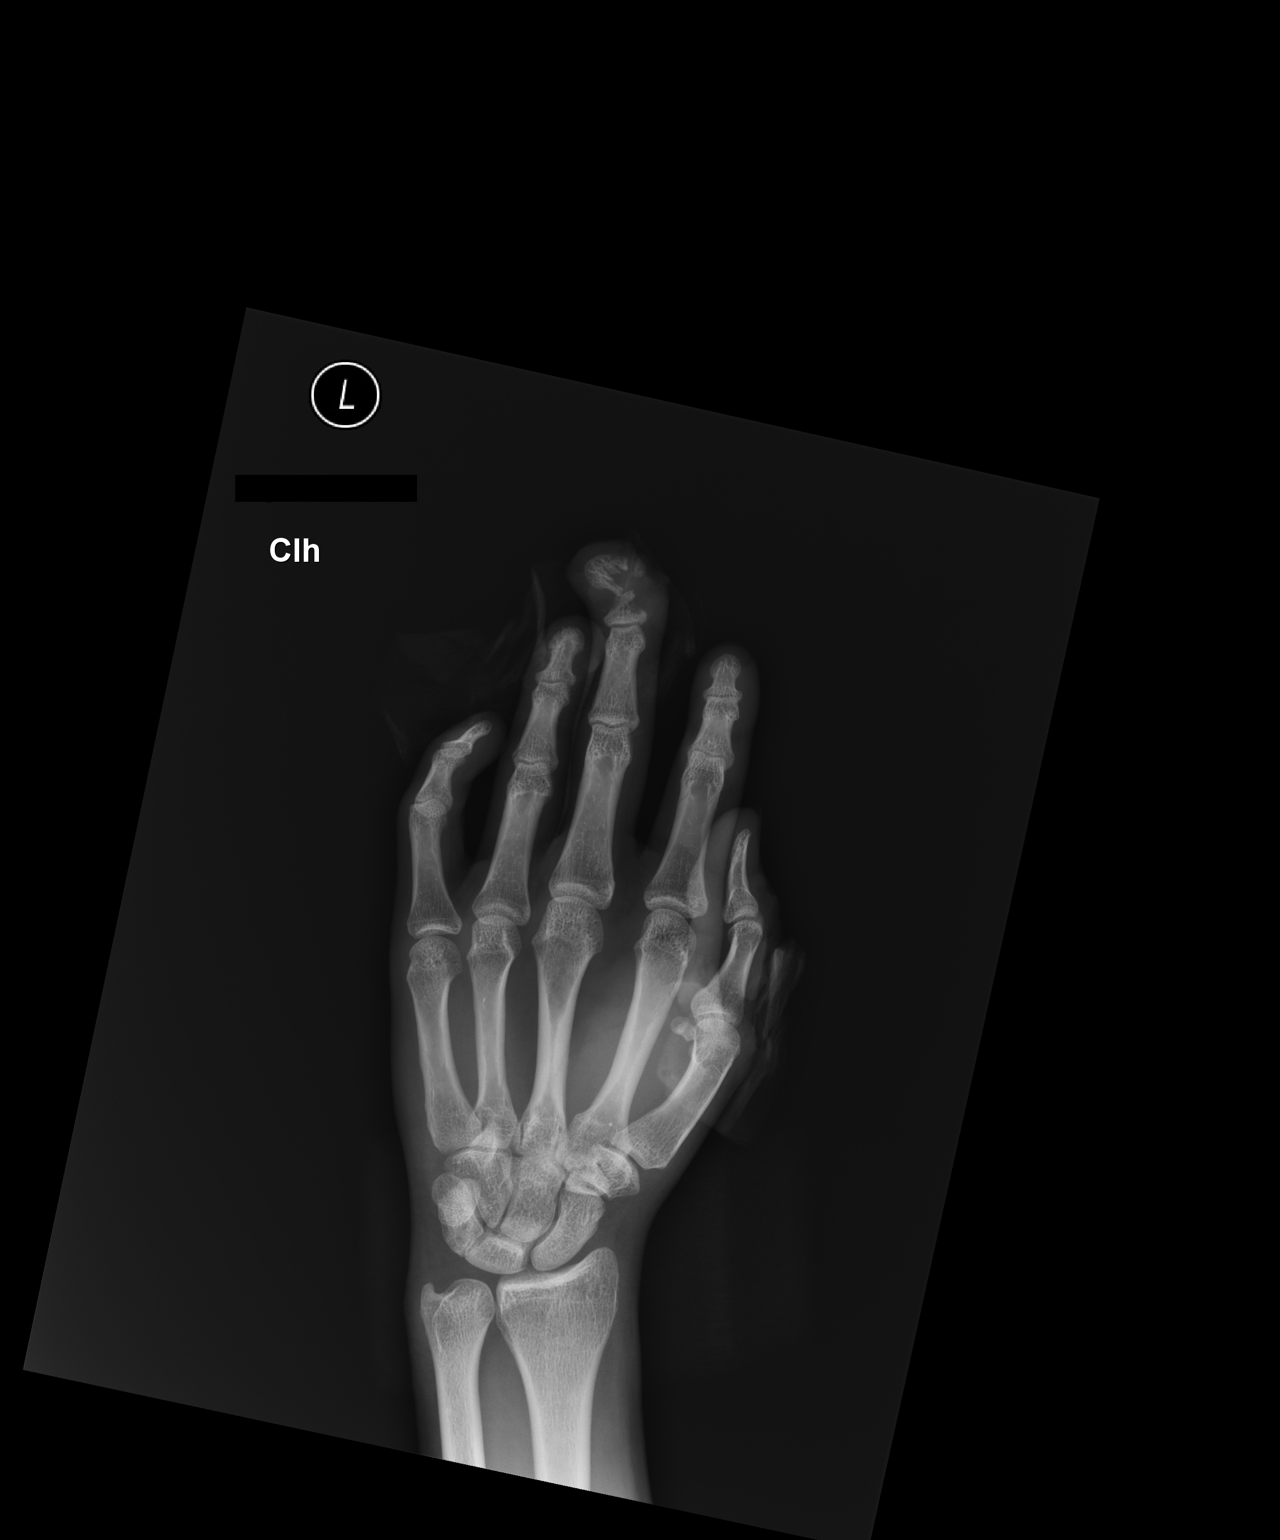

[2 of 2 positions shown; findings below may reference images not displayed]

FINDINGS: Highly comminuted displaced fracture of the third tuft, without
intra-articular extension. Medial deviation of distal bony
fragments. No dislocation. No destructive bony lesions. Third digit
distal aspect soft tissue regular most consistent with laceration
open fracture.
IMPRESSION: Third digit distal phalanx open displaced fracture without
dislocation.

  By: Mohini Galo

## 2015-07-27 ENCOUNTER — Encounter (HOSPITAL_COMMUNITY): Payer: Self-pay

## 2015-07-27 ENCOUNTER — Ambulatory Visit (HOSPITAL_COMMUNITY)
Admission: EM | Admit: 2015-07-27 | Discharge: 2015-07-27 | Disposition: A | Discharge status: Home / Self Care | Payer: No Typology Code available for payment source | Attending: Emergency Medicine | Admitting: Emergency Medicine

## 2015-07-27 DIAGNOSIS — R112 Nausea with vomiting, unspecified: Secondary | ICD-10-CM

## 2015-07-27 DIAGNOSIS — M272 Inflammatory conditions of jaws: Secondary | ICD-10-CM

## 2015-07-27 DIAGNOSIS — K029 Dental caries, unspecified: Secondary | ICD-10-CM

## 2015-07-27 MED ORDER — ONDANSETRON 4 MG PO TBDP
4.0000 mg | ORAL_TABLET | Freq: Once | ORAL | Status: AC
  Administered 2015-07-27: 4 mg via ORAL

## 2015-07-27 MED ORDER — HYDROCODONE-ACETAMINOPHEN 5-325 MG PO TABS: ORAL_TABLET | ORAL | Status: DC

## 2015-07-27 MED ORDER — PENICILLIN V POTASSIUM 500 MG PO TABS: 500.0000 mg | ORAL_TABLET | Freq: Four times a day (QID) | ORAL | Status: DC

## 2015-07-27 MED ORDER — ONDANSETRON 4 MG PO TBDP
ORAL_TABLET | ORAL | Status: AC
  Filled 2015-07-27: qty 1

## 2015-07-27 MED ORDER — LIDOCAINE VISCOUS 2 % MT SOLN: 10.0000 mL | OROMUCOSAL | Status: DC | PRN

## 2015-07-27 MED ORDER — ONDANSETRON 8 MG PO TBDP: 8.0000 mg | ORAL_TABLET | Freq: Three times a day (TID) | ORAL | Status: DC | PRN

## 2015-07-27 MED ORDER — CHLORHEXIDINE GLUCONATE 0.12 % MT SOLN
OROMUCOSAL | Status: DC
Start: 2015-07-27 — End: 2020-05-29

## 2015-07-27 NOTE — ED Provider Notes (Signed)
HPI  SUBJECTIVE:  Joshua Barron is a 30 y.o. male who presents with tender swelling of progressively increasing size on the roof of his mouth for the past 2 weeks. He states it started off as a "small sore" and is getting bigger. Symptoms are worse with eating, but patient is able tolerate by mouth. Symptoms are better with ice. He has tried Orajel, Goody powder today and has been trying ice. He denies any trauma or burn to his mouth. He denies sore throat or other lesions in his mouth. There is no rash. No fevers. No dental pain. He reports some nausea and 2 episodes of blood-streaked, nonbilious emesis today immediately after taking Goody powders. Denies hematemesis. Denies abdominal pain, abdominal distention, anorexia. He denies any further episodes of emesis. There are no aggravating or alleviating factors. He has not tried anything for this. No questionable leftovers, raw or undercooked foods, he does not take any medications on a regular basis. No sick contacts. Patient does have a history of peptic ulcer disease. Patient is not sure if he has ever had an endoscopy. No history of diabetes, hypertension. PMD: None.  Past Medical History  Diagnosis Date  . Peptic ulcer disease   . Abdominal pain     Past Surgical History  Procedure Laterality Date  . Cholecystectomy N/A 08/20/2012    Procedure: LAPAROSCOPIC CHOLECYSTECTOMY WITH INTRAOPERATIVE CHOLANGIOGRAM;  Surgeon: Valarie Merino, MD;  Location: [facility];  Service: General;  Laterality: N/A;  . Open reduction internal fixation (orif) distal phalanx Left 01/19/2014    Procedure: OPEN REDUCTION INTERNAL FIXATION (ORIF) DISTAL PHALANX  MIDDLE FINGER;  Surgeon: Knute Neu, MD;  Location: [facility];  Service: Plastics;  Laterality: Left;  . Tendon repair Left 01/19/2014    Procedure: TENDON REPAIR OF LEFT THUMB;  Surgeon: Knute Neu, MD;  Location: [facility];  Service: Plastics;  Laterality: Left;  . Laceration repair Left 01/19/2014   Procedure: REPAIR  LACERATION LEFT THUMB;  Surgeon: Knute Neu, MD;  Location: [facility];  Service: Plastics;  Laterality: Left;    No family history on file.  Social History  Substance Use Topics  . Smoking status: Former Games developer  . Smokeless tobacco: Never Used  . Alcohol Use: Yes     Comment: occasional    No current facility-administered medications for this encounter.  Current outpatient prescriptions:  .  chlorhexidine (PERIDEX) 0.12 % solution, 15 mL swish and spit bid, Disp: 480 mL, Rfl: 0 .  HYDROcodone-acetaminophen (NORCO/VICODIN) 5-325 MG tablet, 1-2 tabs q 6hr prn pain, Disp: 20 tablet, Rfl: 0 .  lidocaine (XYLOCAINE) 2 % solution, Use as directed 10 mLs in the mouth or throat every 4 (four) hours as needed for mouth pain. Hold in mouth and spit. Do not swallow., Disp: 200 mL, Rfl: 0 .  ondansetron (ZOFRAN ODT) 8 MG disintegrating tablet, Take 1 tablet (8 mg total) by mouth every 8 (eight) hours as needed for nausea or vomiting., Disp: 20 tablet, Rfl: 0 .  penicillin v potassium (VEETID) 500 MG tablet, Take 1 tablet (500 mg total) by mouth 4 (four) times daily. X 10 days, Disp: 40 tablet, Rfl: 0  No Known Allergies   ROS  As noted in HPI.   Physical Exam  BP 128/89 mmHg  Pulse 72  Temp(Src) 99.5 F (37.5 C) (Oral)  Resp 18  SpO2 100%  Constitutional: Well developed, well nourished, no acute distress Eyes:  EOMI, conjunctiva normal bilaterally HENT: Normocephalic, atraumatic,mucus membranes moist, Poor dentition. Tooth #  12 extensively decayed. No gingival swelling or tenderness. No dental tenderness. Palpable soft tender lesion with fluctuance on the hard palate adjacent to tooth #12. No expressible purulent drainage. No trismus. See picture    Respiratory: Normal inspiratory effort Cardiovascular: Normal rate regular rhythm GI: nondistended, soft, nontender. Normal bowel sounds. No guarding, rebound Back: No CVA tenderness skin: No rash, skin  intact Musculoskeletal: no deformities Neurologic: Alert & oriented x 3, no focal neuro deficits Psychiatric: Speech and behavior appropriate   ED Course   Medications  ondansetron (ZOFRAN-ODT) disintegrating tablet 4 mg (4 mg Oral Given 07/27/15 1554)    Orders Placed This Encounter  Procedures  . Offer Fluids    Standing Status: Standing     Number of Occurrences: 20     Standing Expiration Date:     No results found for this or any previous visit (from the past 24 hour(s)). No results found.  ED Clinical Impression  Hard palate abscess  Dental decay  Non-intractable vomiting with nausea, vomiting of unspecified type   ED Assessment/Plan  he appears well-hydrated. He was given Zofran and was tolerating by mouth prior to discharge. Feel that the emesis was from the Goody's. Doubt infectious cause. No evidence of surgical abdomen here today.  patient has an abscess on the hard palate most likely secondary to poor dentition. Plan to send home with penicillin, chlorhexidine mouthwash, Viscous Xylocaine, tramadol. No NSAIDs given the blood-streaked vomiting and history of peptic ulcers. Giving dental referral resource list.  Discussed MDM, plan and followup with patient. Discussed sn/sx that should prompt return to the ED. Patient agrees with plan.   *This clinic note was created using Dragon dictation software. Therefore, there may be occasional mistakes despite careful proofreading.  ?   Domenick GongAshley Savino Whisenant, MD 07/27/15 1747

## 2015-07-27 NOTE — Discharge Instructions (Signed)
Go to www.goodrx.com to look up your medications. This will give you a list of where you can find your prescriptions at the most affordable prices.  ° °RESOURCE GUIDE ° °Dental Problems ° °Look up http://www.ncdental.org/[email or web address].asp for a schedule of the [employer]'s free dental clinics called [facility]. They have clinics all around [place]. Get there early and be prepared to wait.  ° °[facility] °[address]  [place], [place] [address] °[phone] ° °[facility] °[address] °[place], [place] °[phone] ° °Patients with Medicaid: ° Family Dentistry                     [facility] °[address]                                [address] °Phone:  [phone]                                                  Phone:  [phone] ° °If unable to pay or uninsured, contact:  [facility] or [facility] to become qualified for the adult dental clinic. ° °

## 2015-07-27 NOTE — ED Notes (Signed)
Patient presents with mouth sore roof of mouth x1 week pt complains of vomiting blood on this morning. Pt applied oral gel to sore and has taken Goody powder for pain No acute distress

## 2015-07-29 ENCOUNTER — Encounter (HOSPITAL_COMMUNITY): Payer: Self-pay | Admitting: *Deleted

## 2015-07-29 ENCOUNTER — Emergency Department (HOSPITAL_COMMUNITY)
Admission: EM | Admit: 2015-07-29 | Discharge: 2015-07-29 | Disposition: A | Discharge status: Home / Self Care | Payer: Self-pay | Attending: Emergency Medicine | Admitting: Emergency Medicine

## 2015-07-29 DIAGNOSIS — K029 Dental caries, unspecified: Secondary | ICD-10-CM | POA: Insufficient documentation

## 2015-07-29 DIAGNOSIS — K122 Cellulitis and abscess of mouth: Secondary | ICD-10-CM | POA: Insufficient documentation

## 2015-07-29 DIAGNOSIS — Z8711 Personal history of peptic ulcer disease: Secondary | ICD-10-CM | POA: Insufficient documentation

## 2015-07-29 DIAGNOSIS — Z87891 Personal history of nicotine dependence: Secondary | ICD-10-CM | POA: Insufficient documentation

## 2015-07-29 MED ORDER — CLINDAMYCIN HCL 150 MG PO CAPS: 300.0000 mg | ORAL_CAPSULE | Freq: Three times a day (TID) | ORAL | Status: DC

## 2015-07-29 MED ORDER — PROMETHAZINE HCL 25 MG PO TABS
25.0000 mg | ORAL_TABLET | Freq: Four times a day (QID) | ORAL | Status: DC | PRN
Start: 2015-07-29 — End: 2020-05-29

## 2015-07-29 MED ORDER — CLINDAMYCIN HCL 150 MG PO CAPS
300.0000 mg | ORAL_CAPSULE | Freq: Once | ORAL | Status: AC
  Administered 2015-07-29: 300 mg via ORAL
  Filled 2015-07-29: qty 2

## 2015-07-29 MED ORDER — LIDOCAINE HCL (PF) 1 % IJ SOLN
INTRAMUSCULAR | Status: AC
  Administered 2015-07-29: 5 mL
  Filled 2015-07-29: qty 5

## 2015-07-29 MED ORDER — PROMETHAZINE HCL 25 MG PO TABS
25.0000 mg | ORAL_TABLET | Freq: Once | ORAL | Status: AC
  Administered 2015-07-29: 25 mg via ORAL
  Filled 2015-07-29: qty 1

## 2015-07-29 MED ORDER — CEFTRIAXONE SODIUM 1 G IJ SOLR
1.0000 g | Freq: Once | INTRAMUSCULAR | Status: AC
  Administered 2015-07-29: 1 g via INTRAMUSCULAR
  Filled 2015-07-29: qty 10

## 2015-07-29 NOTE — ED Provider Notes (Signed)
CSN: [id number]     Arrival date & time 07/29/15  0007 History   First MD Initiated Contact with Patient 07/29/15 0118     Chief Complaint  Patient presents with  . Oral Swelling     (Consider location/radiation/quality/duration/timing/severity/associated sxs/prior Treatment) HPI Joshua Barron is a 30 y.o. male who presents to the ED with swelling and draining from an area in the roof of his mouth. The area started about 2 weeks ago as a small lump and then got larger and more painful. Patient was evaluated at the Urgent Care yesterday and started on Penicillin, hydrocodone and Zofran. Patient states he did not get the Zofran filled due to the cost. He has had nausea today that occurs after he eats and takes his medications. Patient was given dental resource guide but he did not call anyone due to lack of money. He returns tonight stating that he area is still painful, however, it has drained and is smaller. He states that he need a medication he can afford for the nausea.   Past Medical History  Diagnosis Date  . Peptic ulcer disease   . Abdominal pain    Past Surgical History  Procedure Laterality Date  . Cholecystectomy N/A 08/20/2012    Procedure: LAPAROSCOPIC CHOLECYSTECTOMY WITH INTRAOPERATIVE CHOLANGIOGRAM;  Surgeon: Valarie Merino, MD;  Location: [facility];  Service: General;  Laterality: N/A;  . Open reduction internal fixation (orif) distal phalanx Left 01/19/2014    Procedure: OPEN REDUCTION INTERNAL FIXATION (ORIF) DISTAL PHALANX  MIDDLE FINGER;  Surgeon: Knute Neu, MD;  Location: [facility];  Service: Plastics;  Laterality: Left;  . Tendon repair Left 01/19/2014    Procedure: TENDON REPAIR OF LEFT THUMB;  Surgeon: Knute Neu, MD;  Location: [facility];  Service: Plastics;  Laterality: Left;  . Laceration repair Left 01/19/2014    Procedure: REPAIR  LACERATION LEFT THUMB;  Surgeon: Knute Neu, MD;  Location: [facility];  Service: Plastics;  Laterality: Left;   No family history  on file. Social History  Substance Use Topics  . Smoking status: Former Games developer  . Smokeless tobacco: Never Used  . Alcohol Use: Yes     Comment: occasional    Review of Systems  HENT: Positive for dental problem.   Skin: Positive for wound.       Tender draining area of hard palate       Allergies  Review of patient's allergies indicates no known allergies.  Home Medications   Prior to Admission medications   Medication Sig Start Date End Date Taking? Authorizing Provider  chlorhexidine (PERIDEX) 0.12 % solution 15 mL swish and spit bid 07/27/15   Domenick Gong, MD  clindamycin (CLEOCIN) 150 MG capsule Take 2 capsules (300 mg total) by mouth 3 (three) times daily. 07/29/15   Hope Orlene Och, NP  HYDROcodone-acetaminophen (NORCO/VICODIN) 5-325 MG tablet 1-2 tabs q 6hr prn pain 07/27/15   Domenick Gong, MD  lidocaine (XYLOCAINE) 2 % solution Use as directed 10 mLs in the mouth or throat every 4 (four) hours as needed for mouth pain. Hold in mouth and spit. Do not swallow. 07/27/15   Domenick Gong, MD  ondansetron (ZOFRAN ODT) 8 MG disintegrating tablet Take 1 tablet (8 mg total) by mouth every 8 (eight) hours as needed for nausea or vomiting. 07/27/15   Domenick Gong, MD  penicillin v potassium (VEETID) 500 MG tablet Take 1 tablet (500 mg total) by mouth 4 (four) times daily. X 10 days 07/27/15   Morrie Sheldon  Chaney MallingMortenson, MD  promethazine (PHENERGAN) 25 MG tablet Take 1 tablet (25 mg total) by mouth every 6 (six) hours as needed for nausea or vomiting. 07/29/15   Hope Orlene OchM Neese, NP   BP 119/84 mmHg  Pulse 106  Temp(Src) 98.6 F (37 C) (Oral)  Resp 20  SpO2 97% Physical Exam  Constitutional: He is oriented to person, place, and time. He appears well-developed and well-nourished. No distress.  HENT:  Head: Normocephalic.  Mouth/Throat: Uvula is midline.    Tender raised area to the hard palate that has small amount of drainage. Multiple dental caries with bleeding from the left upper  3rd molar.   Eyes: EOM are normal.  Neck: Neck supple.  Cardiovascular: Normal rate.   Pulmonary/Chest: Effort normal.  Musculoskeletal: Normal range of motion.  Neurological: He is alert and oriented to person, place, and time. No cranial nerve deficit.  Skin: Skin is warm and dry.  Psychiatric: He has a normal mood and affect. His behavior is normal.  Nursing note and vitals reviewed.   ED Course  Procedures (including critical care time) Rocephin 1 gram IM Phenergan 25 mg PO  Labs Review Labs Reviewed - No data to display  Imaging Review No results found.  MDM  30 y.o. male with oral abscess stable for d/c without fever, trismus and does not appear toxic. Patient given dentist on call number and encouraged to call this morning for appointment for follow up. Rx for Clindamycin and Phenergan.   Final diagnoses:  Oral abscess  Dental caries       [facility] Neese, NP 07/29/15 0222  Derwood KaplanAnkit Nanavati, MD 07/29/15 [phone]

## 2015-07-29 NOTE — ED Notes (Signed)
The pt has swelling in his mouth for 2 weeks he was seen at [facility] yesterday and given antibiotics  Vomiting after he eats today

## 2015-07-29 NOTE — Discharge Instructions (Signed)
Call Dr. Kelly SplinterKoelling's office first thing in the morning and tell them [facility] and referred to their office.

## 2017-07-22 ENCOUNTER — Emergency Department (HOSPITAL_COMMUNITY)
Admission: EM | Admit: 2017-07-22 | Discharge: 2017-07-22 | Disposition: A | Discharge status: Home / Self Care | Payer: Self-pay | Attending: Emergency Medicine | Admitting: Emergency Medicine

## 2017-07-22 ENCOUNTER — Encounter (HOSPITAL_COMMUNITY): Payer: Self-pay

## 2017-07-22 DIAGNOSIS — R740 Nonspecific elevation of levels of transaminase and lactic acid dehydrogenase [LDH]: Secondary | ICD-10-CM | POA: Insufficient documentation

## 2017-07-22 DIAGNOSIS — R112 Nausea with vomiting, unspecified: Secondary | ICD-10-CM | POA: Insufficient documentation

## 2017-07-22 DIAGNOSIS — Z87891 Personal history of nicotine dependence: Secondary | ICD-10-CM | POA: Insufficient documentation

## 2017-07-22 DIAGNOSIS — J020 Streptococcal pharyngitis: Secondary | ICD-10-CM | POA: Insufficient documentation

## 2017-07-22 DIAGNOSIS — D72829 Elevated white blood cell count, unspecified: Secondary | ICD-10-CM | POA: Insufficient documentation

## 2017-07-22 DIAGNOSIS — R7401 Elevation of levels of liver transaminase levels: Secondary | ICD-10-CM

## 2017-07-22 DIAGNOSIS — Z79899 Other long term (current) drug therapy: Secondary | ICD-10-CM | POA: Insufficient documentation

## 2017-07-22 DIAGNOSIS — J039 Acute tonsillitis, unspecified: Secondary | ICD-10-CM | POA: Insufficient documentation

## 2017-07-22 LAB — COMPREHENSIVE METABOLIC PANEL
ALBUMIN: 3.7 g/dL (ref 3.5–5.0)
ALT: 152 U/L — ABNORMAL HIGH (ref 17–63)
ANION GAP: 12 (ref 5–15)
AST: 74 U/L — AB (ref 15–41)
Alkaline Phosphatase: 145 U/L — ABNORMAL HIGH (ref 38–126)
BUN: 9 mg/dL (ref 6–20)
CHLORIDE: 98 mmol/L — AB (ref 101–111)
CO2: 26 mmol/L (ref 22–32)
Calcium: 9.3 mg/dL (ref 8.9–10.3)
Creatinine, Ser: 1.06 mg/dL (ref 0.61–1.24)
GFR calc Af Amer: >60 mL/min (ref >60)
GFR calc non Af Amer: >60 mL/min (ref >60)
GLUCOSE: 169 mg/dL — AB (ref 65–99)
POTASSIUM: 4.1 mmol/L (ref 3.5–5.1)
Sodium: 136 mmol/L (ref 135–145)
TOTAL PROTEIN: 8.4 g/dL — AB (ref 6.5–8.1)
Total Bilirubin: 1 mg/dL (ref 0.3–1.2)

## 2017-07-22 LAB — CBC
HEMATOCRIT: 47.7 % (ref 39.0–52.0)
HEMOGLOBIN: 15.3 g/dL (ref 13.0–17.0)
MCH: 30.1 pg (ref 26.0–34.0)
MCHC: 32.1 g/dL (ref 30.0–36.0)
MCV: 93.9 fL (ref 78.0–100.0)
Platelets: 324 10*3/uL (ref 150–400)
RBC: 5.08 MIL/uL (ref 4.22–5.81)
RDW: 12.5 % (ref 11.5–15.5)
WBC: 14.9 10*3/uL — AB (ref 4.0–10.5)

## 2017-07-22 LAB — GROUP A STREP BY PCR: Group A Strep by PCR: DETECTED — AB

## 2017-07-22 LAB — LIPASE, BLOOD: LIPASE: 25 U/L (ref 11–51)

## 2017-07-22 MED ORDER — ONDANSETRON 4 MG PO TBDP: 4.0000 mg | ORAL_TABLET | Freq: Three times a day (TID) | ORAL | 0 refills | Status: DC | PRN

## 2017-07-22 MED ORDER — DEXAMETHASONE SODIUM PHOSPHATE 10 MG/ML IJ SOLN
10.0000 mg | Freq: Once | INTRAMUSCULAR | Status: AC
  Administered 2017-07-22: 10 mg via INTRAVENOUS
  Filled 2017-07-22: qty 1

## 2017-07-22 MED ORDER — LIDOCAINE VISCOUS HCL 2 % MT SOLN
15.0000 mL | Freq: Once | OROMUCOSAL | Status: AC
  Administered 2017-07-22: 15 mL via OROMUCOSAL
  Filled 2017-07-22: qty 15

## 2017-07-22 MED ORDER — SODIUM CHLORIDE 0.9 % IV BOLUS
2000.0000 mL | Freq: Once | INTRAVENOUS | Status: AC
  Administered 2017-07-22: 2000 mL via INTRAVENOUS

## 2017-07-22 MED ORDER — PENICILLIN G BENZATHINE 1200000 UNIT/2ML IM SUSP
1.2000 10*6.[IU] | Freq: Once | INTRAMUSCULAR | Status: AC
  Administered 2017-07-22: 1.2 10*6.[IU] via INTRAMUSCULAR
  Filled 2017-07-22: qty 2

## 2017-07-22 MED ORDER — ONDANSETRON HCL 4 MG/2ML IJ SOLN
4.0000 mg | Freq: Once | INTRAMUSCULAR | Status: AC
  Administered 2017-07-22: 4 mg via INTRAVENOUS
  Filled 2017-07-22: qty 2

## 2017-07-22 NOTE — ED Triage Notes (Addendum)
Pt reports sore throat since Monday and right ear pain since Wednesday. Pt states he has experienced several episodes of vomiting since Wednesday night

## 2017-07-22 NOTE — Discharge Instructions (Addendum)
Continue to stay well-hydrated. Gargle warm salt water and spit it out and use chloraseptic spray as needed for sore throat. You were treated for your strep throat today, so you should start to notice some improvement in the next few days. Use zofran as directed as needed for nausea. Continue to alternate between Tylenol and Ibuprofen for pain or fever. Use Mucinex for cough suppression/expectoration of mucus. Use netipot and flonase to help with nasal congestion. May consider over-the-counter Benadryl or other antihistamine to decrease secretions and for help with your symptoms. Follow up with the [facility] in 5-7 days for recheck of ongoing symptoms and to establish medical care. Return to emergency department for emergent changing or worsening of symptoms.

## 2017-07-22 NOTE — ED Notes (Signed)
Pt was able to tolerate a small amount of viscous lidocaine. He spit the rest out. He is also now tolerating small sips of water.

## 2017-07-22 NOTE — ED Notes (Signed)
Pt verbalized understanding of discharge instructions and denies any further questions at this time.   

## 2017-07-22 NOTE — ED Provider Notes (Signed)
[facility] EMERGENCY DEPARTMENT Provider Note   CSN: [id number] Arrival date & time: 07/22/17  4098     History   Chief Complaint Chief Complaint  Patient presents with  . Sore Throat  . Emesis    HPI Joshua Barron is a 32 y.o. male with a PMHx of PUD and PSHx of cholecystectomy, who presents to the ED with complaints of sore throat radiating to the left ear for the last 4 days with associated chills.  For the last 2 days he has had nausea and a total of 6 episodes of nonbloody nonbilious emesis.  He has tried DayQuil, NyQuil, cough drops, and throat spray with no relief of his symptoms, and his symptoms worsen with swallowing.  He admits to taking NSAIDs fairly frequently, despite having a stomach ulcer.  He denies any ear drainage, drooling, trismus, fevers, cough, rhinorrhea, CP, SOB, abdominal pain, hematemesis, diarrhea, constipation, dysuria, hematuria, myalgias, arthralgias, numbness, tingling, focal weakness, or any other complaints at this time.  No known sick contacts.  He is a non-smoker.  He denies any recent travel, suspicious food intake, or alcohol use.  The history is provided by the patient and medical records. No language interpreter was used.    Past Medical History:  Diagnosis Date  . Abdominal pain   . Peptic ulcer disease     Patient Active Problem List   Diagnosis Date Noted  . S/P laparoscopic cholecystectomy June 2014 08/20/2012    Past Surgical History:  Procedure Laterality Date  . CHOLECYSTECTOMY N/A 08/20/2012   Procedure: LAPAROSCOPIC CHOLECYSTECTOMY WITH INTRAOPERATIVE CHOLANGIOGRAM;  Surgeon: Valarie Merino, MD;  Location: [facility] ORS;  Service: General;  Laterality: N/A;  . LACERATION REPAIR Left 01/19/2014   Procedure: REPAIR  LACERATION LEFT THUMB;  Surgeon: Knute Neu, MD;  Location: [facility] OR;  Service: Plastics;  Laterality: Left;  . OPEN REDUCTION INTERNAL FIXATION (ORIF) DISTAL PHALANX Left 01/19/2014   Procedure: OPEN  REDUCTION INTERNAL FIXATION (ORIF) DISTAL PHALANX  MIDDLE FINGER;  Surgeon: Knute Neu, MD;  Location: [facility] OR;  Service: Plastics;  Laterality: Left;  . TENDON REPAIR Left 01/19/2014   Procedure: TENDON REPAIR OF LEFT THUMB;  Surgeon: Knute Neu, MD;  Location: [facility] OR;  Service: Plastics;  Laterality: Left;        Home Medications    Prior to Admission medications   Medication Sig Start Date End Date Taking? Authorizing Provider  chlorhexidine (PERIDEX) 0.12 % solution 15 mL swish and spit bid 07/27/15   Domenick Gong, MD  clindamycin (CLEOCIN) 150 MG capsule Take 2 capsules (300 mg total) by mouth 3 (three) times daily. 07/29/15   Janne Napoleon, NP  HYDROcodone-acetaminophen (NORCO/VICODIN) 5-325 MG tablet 1-2 tabs q 6hr prn pain 07/27/15   Domenick Gong, MD  lidocaine (XYLOCAINE) 2 % solution Use as directed 10 mLs in the mouth or throat every 4 (four) hours as needed for mouth pain. Hold in mouth and spit. Do not swallow. 07/27/15   Domenick Gong, MD  ondansetron (ZOFRAN ODT) 8 MG disintegrating tablet Take 1 tablet (8 mg total) by mouth every 8 (eight) hours as needed for nausea or vomiting. 07/27/15   Domenick Gong, MD  penicillin v potassium (VEETID) 500 MG tablet Take 1 tablet (500 mg total) by mouth 4 (four) times daily. X 10 days 07/27/15   Domenick Gong, MD  promethazine (PHENERGAN) 25 MG tablet Take 1 tablet (25 mg total) by mouth every 6 (six) hours as needed for nausea or  vomiting. 07/29/15   Janne Napoleon, NP    Family History No family history on file.  Social History Social History   Tobacco Use  . Smoking status: Former Games developer  . Smokeless tobacco: Never Used  Substance Use Topics  . Alcohol use: Yes    Comment: occasional  . Drug use: No     Allergies   Patient has no known allergies.   Review of Systems Review of Systems  Constitutional: Positive for chills. Negative for fever.  HENT: Positive for ear pain and sore throat. Negative for  drooling, ear discharge, rhinorrhea and trouble swallowing.   Respiratory: Negative for cough and shortness of breath.   Cardiovascular: Negative for chest pain.  Gastrointestinal: Positive for nausea and vomiting. Negative for abdominal pain, constipation and diarrhea.  Genitourinary: Negative for dysuria and hematuria.  Musculoskeletal: Negative for arthralgias and myalgias.  Skin: Negative for color change.  Allergic/Immunologic: Negative for immunocompromised state.  Neurological: Negative for weakness and numbness.  Psychiatric/Behavioral: Negative for confusion.   All other systems reviewed and are negative for acute change except as noted in the HPI.    Physical Exam Updated Vital Signs BP 116/86 (BP Location: Right Arm)   Pulse 89   Temp 99.8 F (37.7 C) (Oral)   Resp 16   Ht  (1.676 m)   Wt 79.4 kg (175 lb)   SpO2 98%   BMI 28.25 kg/m   Physical Exam  Constitutional: He is oriented to person, place, and time. Vital signs are normal. He appears well-developed and well-nourished.  Non-toxic appearance. No distress.  Low-grade temp 99.8, nontoxic, NAD  HENT:  Head: Normocephalic and atraumatic.  Right Ear: Hearing, external ear and ear canal normal. Tympanic membrane is injected. Tympanic membrane is not bulging. A middle ear effusion (serous) is present.  Left Ear: Hearing, external ear and ear canal normal. Tympanic membrane is injected. Tympanic membrane is not bulging. A middle ear effusion (serous) is present.  Nose: Nose normal.  Mouth/Throat: Uvula is midline and mucous membranes are normal. No trismus in the jaw. No uvula swelling. Oropharyngeal exudate, posterior oropharyngeal edema and posterior oropharyngeal erythema present. No tonsillar abscesses. Tonsils are 3+ on the right. Tonsils are 4+ on the left. Tonsillar exudate.  Ears with mild serous effusion and injected TMs bilaterally but no bulging of TM and no suppurative effusions. Nose clear. Oropharynx  erythematous without uvular swelling or deviation, no trismus or drooling, 3+ R tonsillar swelling and 4+ L tonsillar swelling which is causing the tonsils to touch but there does not appear to be a PTA, +erythema, +exudates.  Slightly muffled voice but when he swallows fully he's able to speak much clearer  Eyes: Conjunctivae and EOM are normal. Right eye exhibits no discharge. Left eye exhibits no discharge.  Neck: Normal range of motion. Neck supple.  Cardiovascular: Normal rate, regular rhythm, normal heart sounds and intact distal pulses. Exam reveals no gallop and no friction rub.  No murmur heard. Pulmonary/Chest: Effort normal and breath sounds normal. No respiratory distress. He has no decreased breath sounds. He has no wheezes. He has no rhonchi. He has no rales.  Abdominal: Soft. Normal appearance and bowel sounds are normal. He exhibits no distension. There is no tenderness. There is no rigidity, no rebound, no guarding, no CVA tenderness, no tenderness at McBurney's point and negative Murphy's sign.  Soft, NTND, +BS throughout, no r/g/r, neg murphy's, neg mcburney's, no CVA TTP   Musculoskeletal: Normal range of motion.  Lymphadenopathy:       Head (right side): Tonsillar adenopathy present.       Head (left side): Tonsillar adenopathy present.  B/L tonsillar LAD which is mildly TTP  Neurological: He is alert and oriented to person, place, and time. He has normal strength. No sensory deficit.  Skin: Skin is warm, dry and intact. No rash noted.  Psychiatric: He has a normal mood and affect.  Nursing note and vitals reviewed.    ED Treatments / Results  Labs (all labs ordered are listed, but only abnormal results are displayed) Labs Reviewed  GROUP A STREP BY PCR - Abnormal; Notable for the following components:      Result Value   Group A Strep by PCR DETECTED (*)    All other components within normal limits  COMPREHENSIVE METABOLIC PANEL - Abnormal; Notable for the following  components:   Chloride 98 (*)    Glucose, Bld 169 (*)    Total Protein 8.4 (*)    AST 74 (*)    ALT 152 (*)    Alkaline Phosphatase 145 (*)    All other components within normal limits  CBC - Abnormal; Notable for the following components:   WBC 14.9 (*)    All other components within normal limits  LIPASE, BLOOD    EKG None  Radiology No results found.  Procedures Procedures (including critical care time)  Medications Ordered in ED Medications  sodium chloride 0.9 % bolus 2,000 mL (2,000 mLs Intravenous New Bag/Given 07/22/17 1316)  dexamethasone (DECADRON) injection 10 mg (10 mg Intravenous Given 07/22/17 1316)  penicillin g benzathine (BICILLIN LA) 1200000 UNIT/2ML injection 1.2 Million Units (1.2 Million Units Intramuscular Given 07/22/17 1321)  lidocaine (XYLOCAINE) 2 % viscous mouth solution 15 mL (15 mLs Mouth/Throat Given 07/22/17 1308)  ondansetron (ZOFRAN) injection 4 mg (4 mg Intravenous Given 07/22/17 1314)  lidocaine (XYLOCAINE) 2 % viscous mouth solution 15 mL (15 mLs Mouth/Throat Given 07/22/17 1521)     Initial Impression / Assessment and Plan / ED Course  I have reviewed the triage vital signs and the nursing notes.  Pertinent labs & imaging results that were available during my care of the patient were reviewed by me and considered in my medical decision making (see chart for details).     32 y.o. male here with sore throat x4 days with associated n/v/chills. On exam, R tonsil 3+ and L tonsil 4+ causing them to touch but no uvular deviation and no PTA noted, handling secretions well and without trismus. Ears both with serous effusions and mildly injected but no definite AOM. No abdominal tenderness. Low-grade temp but overall looks pretty good. Work up thus far reveals: lipase WNL, CBC with mild leukocytosis WBC 14.9, CMP with mildly elevated LFTs probably from vomiting, and gluc 169 but pt drinking apple juice, otherwise CMP WNL; strep test positive which is  likely the culprit of his symptoms. Will give IVFs since pt likely is dehydrated from not being able to adequately take in PO for several days, will give decadron and PCN, and viscous lidocaine and zofran. Will reassess shortly.   3:59 PM Pt feeling better and tolerating PO well; first dose of viscous lidocaine didn't really stay on his throat because he tried to gargle and ended up spitting it out, so second dose was given and this helped. Overall, symptoms likely from strep throat, which has been treated here today. Will d/c home with zofran rx, advised adequate hydration and OTC remedies for symptomatic  relief. F/up with [facility] in 1wk for recheck and to establish care. I explained the diagnosis and have given explicit precautions to return to the ER including for any other new or worsening symptoms. The patient understands and accepts the medical plan as it's been dictated and I have answered their questions. Discharge instructions concerning home care and prescriptions have been given. The patient is STABLE and is discharged to home in good condition.    Final Clinical Impressions(s) / ED Diagnoses   Final diagnoses:  Strep pharyngitis  Tonsillitis  Nausea and vomiting in adult patient  Transaminitis  Leukocytosis, unspecified type    ED Discharge Orders        Ordered    ondansetron (ZOFRAN ODT) 4 MG disintegrating tablet  Every 8 hours PRN     07/22/17 [address] [place], [place] 07/22/17 1600    Nira Conn, MD 07/22/17 2240

## 2017-07-22 NOTE — ED Notes (Signed)
ED Provider at bedside. 

## 2017-07-23 ENCOUNTER — Telehealth: Payer: Self-pay

## 2017-07-23 NOTE — Telephone Encounter (Signed)
Post ED Visit - Positive Culture Follow-up  Culture report reviewed by antimicrobial stewardship pharmacist:   Enzo Bi, Pharm.D.  Celedonio Miyamoto, Pharm.D., BCPS AQ-ID  Garvin Fila, Pharm.D., BCPS  Georgina Pillion, Pharm.D., BCPS  [place], [address].D., BCPS, AAHIVP  Estella Husk, Pharm.D., BCPS, AAHIVP  Lysle Pearl, PharmD, BCPS  Sherlynn Carbon, PharmD  Pollyann Samples, PharmD, BCPS Sharin Mons Pharm D Positive Strep culture Treated with Bicillin LA, organism sensitive to the same and no further patient follow-up is required at this time.  Jerry Caras 07/23/2017, 9:41 AM

## 2019-11-05 ENCOUNTER — Ambulatory Visit
Admission: EM | Admit: 2019-11-05 | Discharge: 2019-11-05 | Disposition: A | Discharge status: Home / Self Care | Payer: Self-pay | Attending: Emergency Medicine | Admitting: Emergency Medicine

## 2019-11-05 DIAGNOSIS — Z1152 Encounter for screening for COVID-19: Secondary | ICD-10-CM

## 2019-11-05 NOTE — ED Triage Notes (Signed)
Pt states positive covid exposure from work on Friday. States needs covid testing.

## 2019-11-05 NOTE — Discharge Instructions (Signed)

## 2019-11-05 NOTE — ED Triage Notes (Signed)
Denies sxs. 

## 2019-11-07 LAB — NOVEL CORONAVIRUS, NAA: SARS-CoV-2, NAA: NOT DETECTED

## 2020-05-29 ENCOUNTER — Ambulatory Visit (INDEPENDENT_AMBULATORY_CARE_PROVIDER_SITE_OTHER): Payer: Self-pay

## 2020-05-29 ENCOUNTER — Other Ambulatory Visit: Payer: Self-pay

## 2020-05-29 ENCOUNTER — Ambulatory Visit
Admission: EM | Admit: 2020-05-29 | Discharge: 2020-05-29 | Disposition: A | Discharge status: Home / Self Care | Payer: Self-pay | Attending: Emergency Medicine | Admitting: Emergency Medicine

## 2020-05-29 DIAGNOSIS — M545 Low back pain, unspecified: Secondary | ICD-10-CM

## 2020-05-29 DIAGNOSIS — R079 Chest pain, unspecified: Secondary | ICD-10-CM

## 2020-05-29 DIAGNOSIS — W19XXXA Unspecified fall, initial encounter: Secondary | ICD-10-CM

## 2020-05-29 MED ORDER — TIZANIDINE HCL 2 MG PO TABS: 2.0000 mg | ORAL_TABLET | Freq: Four times a day (QID) | ORAL | 0 refills | Status: AC | PRN

## 2020-05-29 MED ORDER — NAPROXEN 500 MG PO TABS: 500.0000 mg | ORAL_TABLET | Freq: Two times a day (BID) | ORAL | 0 refills | Status: AC

## 2020-05-29 NOTE — Discharge Instructions (Addendum)
X-ray negative for fractures Most likely Lumbar straining/bruising Naprosyn twice daily with food Supplement tizanidine at home/bedtime, this muscle relaxer, may cause drowsiness Alternate ice and heat Follow-up if not improving or worsening

## 2020-05-29 NOTE — ED Provider Notes (Signed)
[facility]    CSN: [id number] Arrival date & time: 05/29/20  1412      History   Chief Complaint Chief Complaint  Patient presents with  . Back Pain    HPI Joshua Barron is a 35 y.o. male presenting today for evaluation of back pain.  Reports 4 to 5 days ago accidentally fell from elevated area and landed on dresser to left lower back.  Reports continued pain for the past 5 days, slight worsening.  Pain with certain movements and deep inspiration.  Denies any urinary problems.  Denies radiation into the legs.  Denies numbness or tingling.  HPI  Past Medical History:  Diagnosis Date  . Abdominal pain   . Peptic ulcer disease     Patient Active Problem List   Diagnosis Date Noted  . S/P laparoscopic cholecystectomy June 2014 08/20/2012    Past Surgical History:  Procedure Laterality Date  . CHOLECYSTECTOMY N/A 08/20/2012   Procedure: LAPAROSCOPIC CHOLECYSTECTOMY WITH INTRAOPERATIVE CHOLANGIOGRAM;  Surgeon: Valarie Merino, MD;  Location: [facility];  Service: General;  Laterality: N/A;  . LACERATION REPAIR Left 01/19/2014   Procedure: REPAIR  LACERATION LEFT THUMB;  Surgeon: Knute Neu, MD;  Location: [facility] OR;  Service: Plastics;  Laterality: Left;  . OPEN REDUCTION INTERNAL FIXATION (ORIF) DISTAL PHALANX Left 01/19/2014   Procedure: OPEN REDUCTION INTERNAL FIXATION (ORIF) DISTAL PHALANX  MIDDLE FINGER;  Surgeon: Knute Neu, MD;  Location: [facility] OR;  Service: Plastics;  Laterality: Left;  . TENDON REPAIR Left 01/19/2014   Procedure: TENDON REPAIR OF LEFT THUMB;  Surgeon: Knute Neu, MD;  Location: [facility] OR;  Service: Plastics;  Laterality: Left;       Home Medications    Prior to Admission medications   Medication Sig Start Date End Date Taking? Authorizing Provider  naproxen (NAPROSYN) 500 MG tablet Take 1 tablet (500 mg total) by mouth 2 (two) times daily. 05/29/20  Yes Pasquale Matters C, PA-C  tiZANidine (ZANAFLEX) 2 MG tablet Take 1-2 tablets (2-4  mg total) by mouth every 6 (six) hours as needed for muscle spasms. 05/29/20  Yes Terrika Zuver C, PA-C  promethazine (PHENERGAN) 25 MG tablet Take 1 tablet (25 mg total) by mouth every 6 (six) hours as needed for nausea or vomiting. 07/29/15 05/29/20  Janne Napoleon, NP    Family History Family History  Problem Relation Age of Onset  . Diabetes Mother   . Cancer Mother     Social History Social History   Tobacco Use  . Smoking status: Former Games developer  . Smokeless tobacco: Never Used  Vaping Use  . Vaping Use: Never used  Substance Use Topics  . Alcohol use: Yes    Comment: occasional  . Drug use: No     Allergies   Patient has no known allergies.   Review of Systems Review of Systems  Constitutional: Negative for fatigue and fever.  Eyes: Negative for redness, itching and visual disturbance.  Respiratory: Negative for shortness of breath.   Cardiovascular: Negative for chest pain and leg swelling.  Gastrointestinal: Negative for nausea and vomiting.  Musculoskeletal: Positive for back pain and myalgias. Negative for arthralgias.  Skin: Negative for color change, rash and wound.  Neurological: Negative for dizziness, syncope, weakness, light-headedness and headaches.     Physical Exam Triage Vital Signs ED Triage Vitals  Enc Vitals Group     BP 05/29/20 1503 121/83     Pulse Rate 05/29/20 1503 76     Resp  05/29/20 1503 18     Temp 05/29/20 1503 98 F (36.7 C)     Temp Source 05/29/20 1503 Oral     SpO2 05/29/20 1503 98 %     Weight --      Height --      Head Circumference --      Peak Flow --      Pain Score 05/29/20 1459 8     Pain Loc --      Pain Edu? --      Excl. in GC? --    No data found.  Updated Vital Signs BP 121/83 (BP Location: Left Arm)   Pulse 76   Temp 98 F (36.7 C) (Oral)   Resp 18   SpO2 98%   Visual Acuity Right Eye Distance:   Left Eye Distance:   Bilateral Distance:    Right Eye Near:   Left Eye Near:    Bilateral  Near:     Physical Exam Vitals and nursing note reviewed.  Constitutional:      Appearance: He is well-developed.     Comments: No acute distress  HENT:     Head: Normocephalic and atraumatic.     Nose: Nose normal.  Eyes:     Conjunctiva/sclera: Conjunctivae normal.  Cardiovascular:     Rate and Rhythm: Normal rate.  Pulmonary:     Effort: Pulmonary effort is normal. No respiratory distress.  Abdominal:     General: There is no distension.  Musculoskeletal:        General: Normal range of motion.     Cervical back: Neck supple.     Comments: Back: No obvious swelling or deformity, nontender to palpation of thoracic, lumbar spine midline, no palpable deformity or step-off, tenderness palpation to upper lumbar musculature and far lateral area, extends slightly over lower thoracic area  Skin:    General: Skin is warm and dry.  Neurological:     Mental Status: He is alert and oriented to person, place, and time.      UC Treatments / Results  Labs (all labs ordered are listed, but only abnormal results are displayed) Labs Reviewed - No data to display  EKG   Radiology DG Ribs Unilateral W/Chest Left  Result Date: 05/29/2020 CLINICAL DATA:  Fall onto dresser 4 days ago. Left rib and chest wall pain. Initial encounter. EXAM: LEFT RIBS AND CHEST - 3+ VIEW COMPARISON:  11/03/2013 FINDINGS: No fracture or other bone lesions are seen involving the ribs. There is no evidence of pneumothorax or pleural effusion. Both lungs are clear. Heart size and mediastinal contours are within normal limits. IMPRESSION: Negative. Electronically Signed   By: Danae Orleans M.D.   On: 05/29/2020 15:48    Procedures Procedures (including critical care time)  Medications Ordered in UC Medications - No data to display  Initial Impression / Assessment and Plan / UC Course  I have reviewed the triage vital signs and the nursing notes.  Pertinent labs & imaging results that were available during my  care of the patient were reviewed by me and considered in my medical decision making (see chart for details).     X-ray negative for lower rib fracture, no other bony tenderness, suspect likely muscle contusion/strain and recommending continued anti-inflammatories and muscle relaxers.  No red flags.  Continue to monitor,Discussed strict return precautions. Patient verbalized understanding and is agreeable with plan.  Final Clinical Impressions(s) / UC Diagnoses   Final diagnoses:  Acute left-sided low  back pain without sciatica     Discharge Instructions     X-ray negative for fractures Most likely Lumbar straining/bruising Naprosyn twice daily with food Supplement tizanidine at home/bedtime, this muscle relaxer, may cause drowsiness Alternate ice and heat Follow-up if not improving or worsening    ED Prescriptions    Medication Sig Dispense Auth. Provider   naproxen (NAPROSYN) 500 MG tablet Take 1 tablet (500 mg total) by mouth 2 (two) times daily. 30 tablet Parissa Chiao C, PA-C   tiZANidine (ZANAFLEX) 2 MG tablet Take 1-2 tablets (2-4 mg total) by mouth every 6 (six) hours as needed for muscle spasms. 30 tablet Oryn Casanova, [place] C, PA-C     PDMP not reviewed this encounter.   Lew Dawes, [place] 05/29/20 1611

## 2020-05-29 NOTE — ED Triage Notes (Signed)
Pt presents with L sided back pain after falling onto edge of dresser over the weekend.  Worse with certain positions and taking deep breaths.  Took Aleve through the night with minimal relief.

## 2021-01-23 ENCOUNTER — Ambulatory Visit
Admission: EM | Admit: 2021-01-23 | Discharge: 2021-01-23 | Disposition: A | Discharge status: Home / Self Care | Payer: Self-pay

## 2021-01-23 ENCOUNTER — Other Ambulatory Visit: Payer: Self-pay

## 2021-01-23 ENCOUNTER — Encounter: Payer: Self-pay | Admitting: Emergency Medicine

## 2021-01-23 DIAGNOSIS — R197 Diarrhea, unspecified: Secondary | ICD-10-CM

## 2021-01-23 NOTE — ED Triage Notes (Signed)
Wednesday started having left lower quadrant abdominal pain with fatigue. Reports mild nausea and diarrhea since the start of symptoms. Denies fever, vomiting, coughing, runny nose, changes to urinary habits, testicular pain, blood in stool. Eating and having bowel movements worsens symptoms. Ibuprofen helps relieves pain. States the last time this happened, he had his gallbladder removed.

## 2021-01-23 NOTE — ED Provider Notes (Signed)
[facility]    CSN: [id number] Arrival date & time: 01/23/21  0272      History   Chief Complaint Chief Complaint  Patient presents with   Abdominal Pain   Fatigue   In Car    HPI Joshua Barron is a 35 y.o. male.   Pt reports he began feeling sick yesterday.  Pt reports he had diarrhea yesterday  Pt reports some abdominal soresess.  Pt reports pain is getting better   The history is provided by the patient. No language interpreter was used.  Abdominal Pain Pain location:  Generalized Pain quality: aching   Pain radiates to:  Does not radiate Pain severity:  Severe Onset quality:  Gradual Duration:  1 day Timing:  Constant Progression:  Worsening Chronicity:  New Relieved by:  Nothing Worsened by:  Nothing Ineffective treatments:  None tried  Past Medical History:  Diagnosis Date   Abdominal pain    Peptic ulcer disease     Patient Active Problem List   Diagnosis Date Noted   S/P laparoscopic cholecystectomy June 2014 08/20/2012    Past Surgical History:  Procedure Laterality Date   CHOLECYSTECTOMY N/A 08/20/2012   Procedure: LAPAROSCOPIC CHOLECYSTECTOMY WITH INTRAOPERATIVE CHOLANGIOGRAM;  Surgeon: Valarie Merino, MD;  Location: [facility];  Service: General;  Laterality: N/A;   LACERATION REPAIR Left 01/19/2014   Procedure: REPAIR  LACERATION LEFT THUMB;  Surgeon: Knute Neu, MD;  Location: [facility];  Service: Plastics;  Laterality: Left;   OPEN REDUCTION INTERNAL FIXATION (ORIF) DISTAL PHALANX Left 01/19/2014   Procedure: OPEN REDUCTION INTERNAL FIXATION (ORIF) DISTAL PHALANX  MIDDLE FINGER;  Surgeon: Knute Neu, MD;  Location: [facility];  Service: Plastics;  Laterality: Left;   TENDON REPAIR Left 01/19/2014   Procedure: TENDON REPAIR OF LEFT THUMB;  Surgeon: Knute Neu, MD;  Location: [facility];  Service: Plastics;  Laterality: Left;       Home Medications    Prior to Admission medications   Medication Sig Start Date End Date Taking?  Authorizing Provider  naproxen (NAPROSYN) 500 MG tablet Take 1 tablet (500 mg total) by mouth 2 (two) times daily. 05/29/20   Wieters, Hallie C, PA-C  tiZANidine (ZANAFLEX) 2 MG tablet Take 1-2 tablets (2-4 mg total) by mouth every 6 (six) hours as needed for muscle spasms. 05/29/20   Wieters, Hallie C, PA-C  promethazine (PHENERGAN) 25 MG tablet Take 1 tablet (25 mg total) by mouth every 6 (six) hours as needed for nausea or vomiting. 07/29/15 05/29/20  Janne Napoleon, NP    Family History Family History  Problem Relation Age of Onset   Diabetes Mother    Cancer Mother     Social History Social History   Tobacco Use   Smoking status: Former   Smokeless tobacco: Never  Building services engineer Use: Never used  Substance Use Topics   Alcohol use: Yes    Comment: occasional   Drug use: No     Allergies   Patient has no known allergies.   Review of Systems Review of Systems  Gastrointestinal:  Positive for abdominal pain.  All other systems reviewed and are negative.   Physical Exam Triage Vital Signs ED Triage Vitals [01/23/21 1138]  Enc Vitals Group     BP 117/75     Pulse Rate 62     Resp 16     Temp 98.5 F (36.9 C)     Temp Source Oral     SpO2  95 %     Weight      Height      Head Circumference      Peak Flow      Pain Score 7     Pain Loc      Pain Edu?      Excl. in [place]?    No data found.  Updated Vital Signs BP 117/75 (BP Location: Left Arm)   Pulse 62   Temp 98.5 F (36.9 C) (Oral)   Resp 16   SpO2 95%   Visual Acuity Right Eye Distance:   Left Eye Distance:   Bilateral Distance:    Right Eye Near:   Left Eye Near:    Bilateral Near:     Physical Exam Vitals reviewed.  Cardiovascular:     Rate and Rhythm: Normal rate and regular rhythm.  Pulmonary:     Effort: Pulmonary effort is normal.     Breath sounds: Normal breath sounds.  Abdominal:     General: Bowel sounds are normal.     Palpations: Abdomen is soft.     Tenderness: There is  abdominal tenderness.  Skin:    General: Skin is warm.  Neurological:     General: No focal deficit present.     Mental Status: He is alert.     UC Treatments / Results  Labs (all labs ordered are listed, but only abnormal results are displayed) Labs Reviewed - No data to display  EKG   Radiology No results found.  Procedures Procedures (including critical care time)  Medications Ordered in UC Medications - No data to display  Initial Impression / Assessment and Plan / UC Course  I have reviewed the triage vital signs and the nursing notes.  Pertinent labs & imaging results that were available during my care of the patient were reviewed by me and considered in my medical decision making (see chart for details).     Final Clinical Impressions(s) / UC Diagnoses   Final diagnoses:  Diarrhea, unspecified type   Discharge Instructions   None    ED Prescriptions   None    PDMP not reviewed this encounter. An After Visit Summary was printed and given to the patient.    Fransico Meadow, [place] 01/23/21 1255

## 2021-05-02 IMAGING — DX DG RIBS W/ CHEST 3+V*L*
3 series · 3 of 3 positions shown · non-contrast
Comparison: 11/03/2013

CLINICAL DATA: Fall onto dresser 4 days ago. Left rib and chest
wall pain. Initial encounter.

EXAM:
LEFT RIBS AND CHEST - 3+ VIEW

[chest pa]
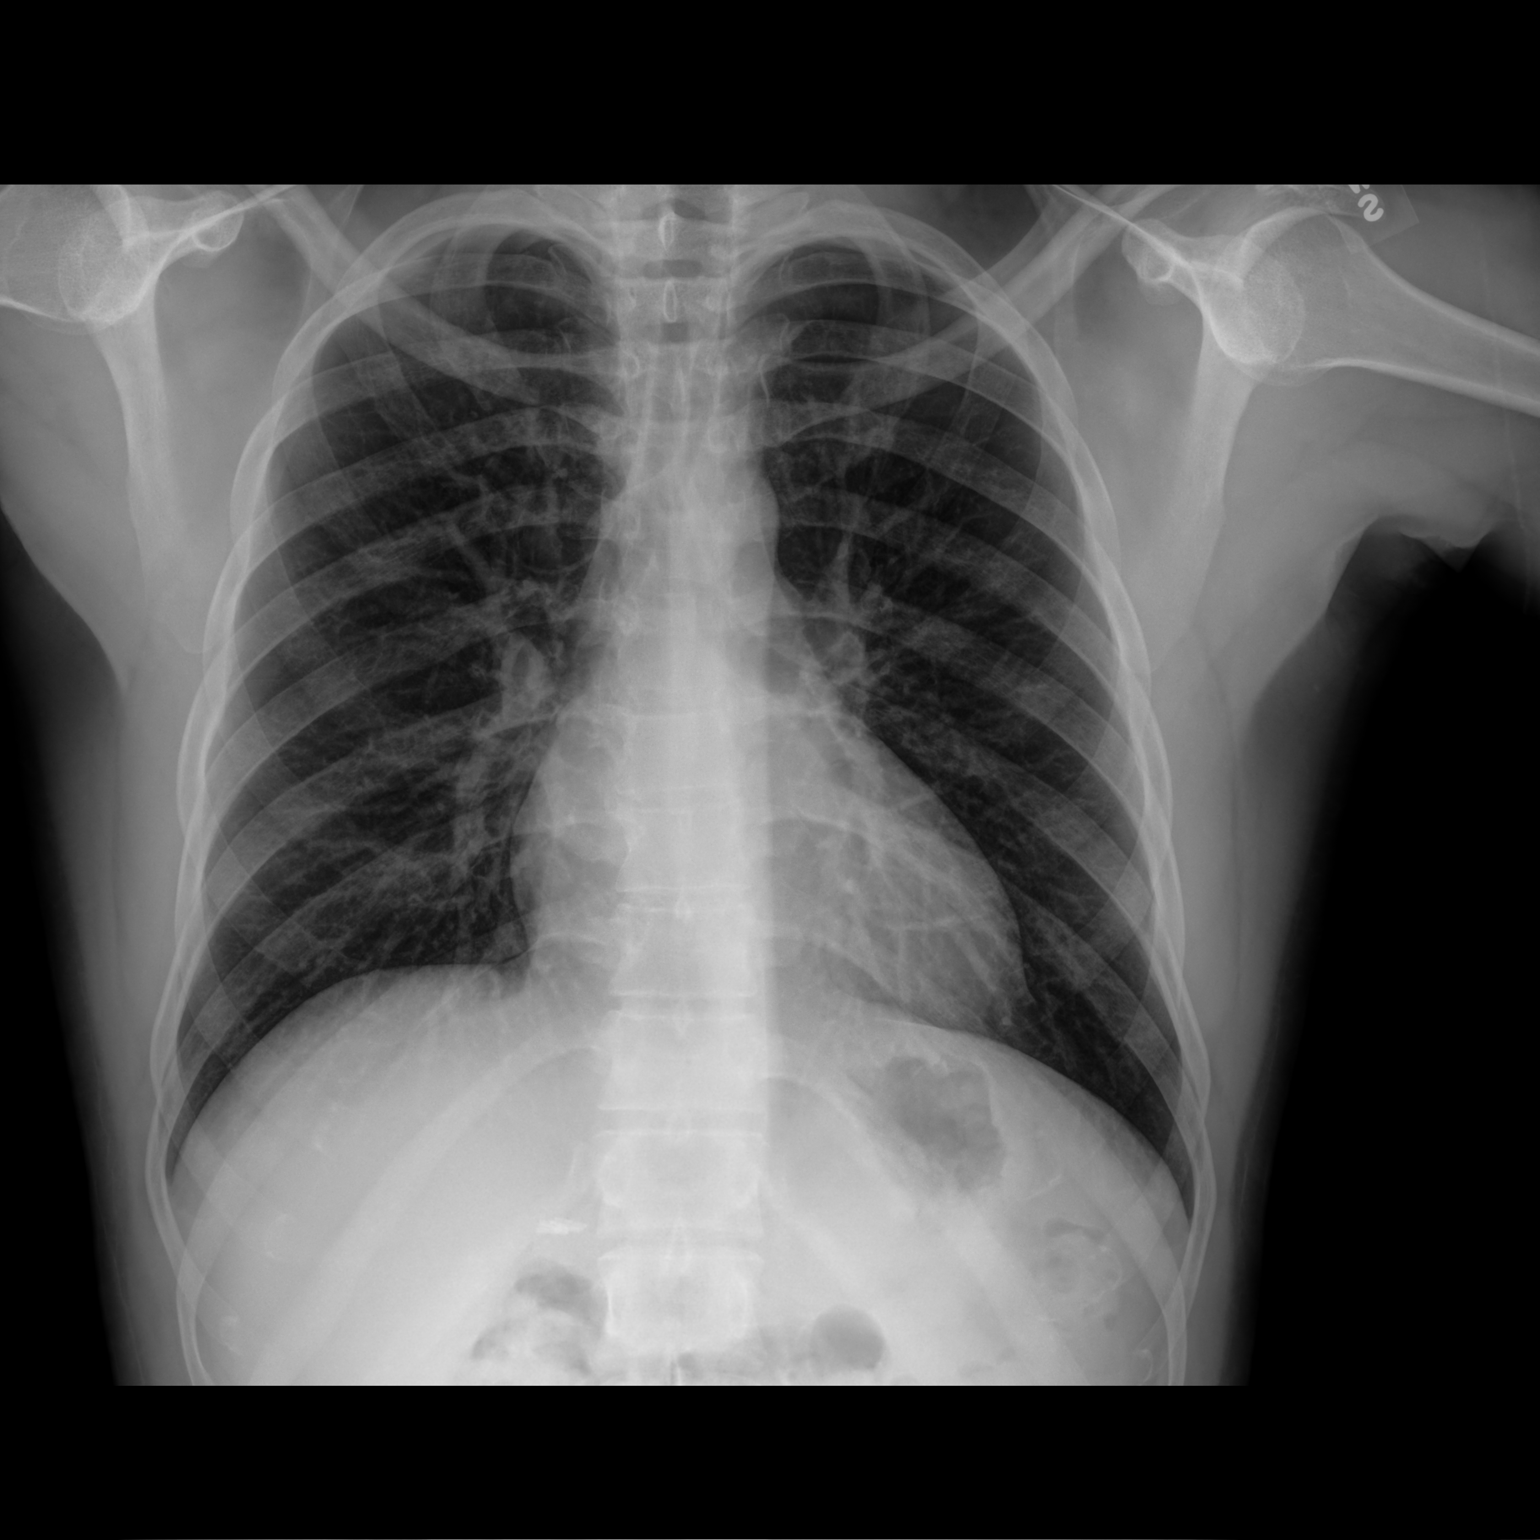

[hemithorax (ribs) ap (1 of 2)]
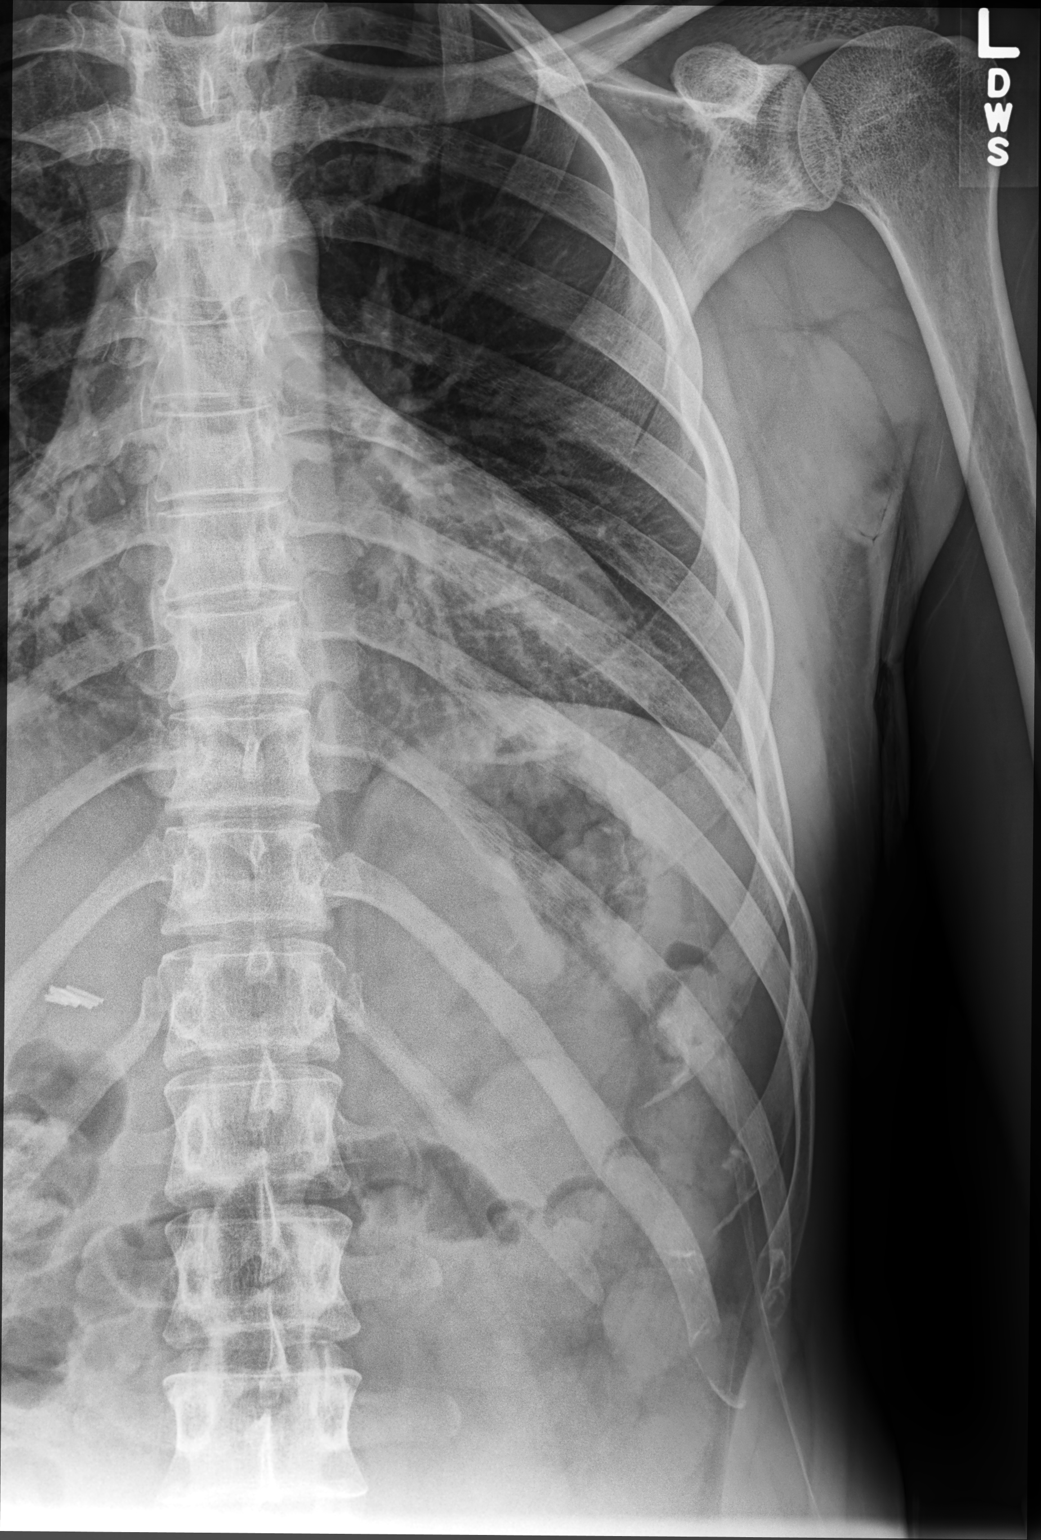

[hemithorax (ribs) ap (2 of 2)]
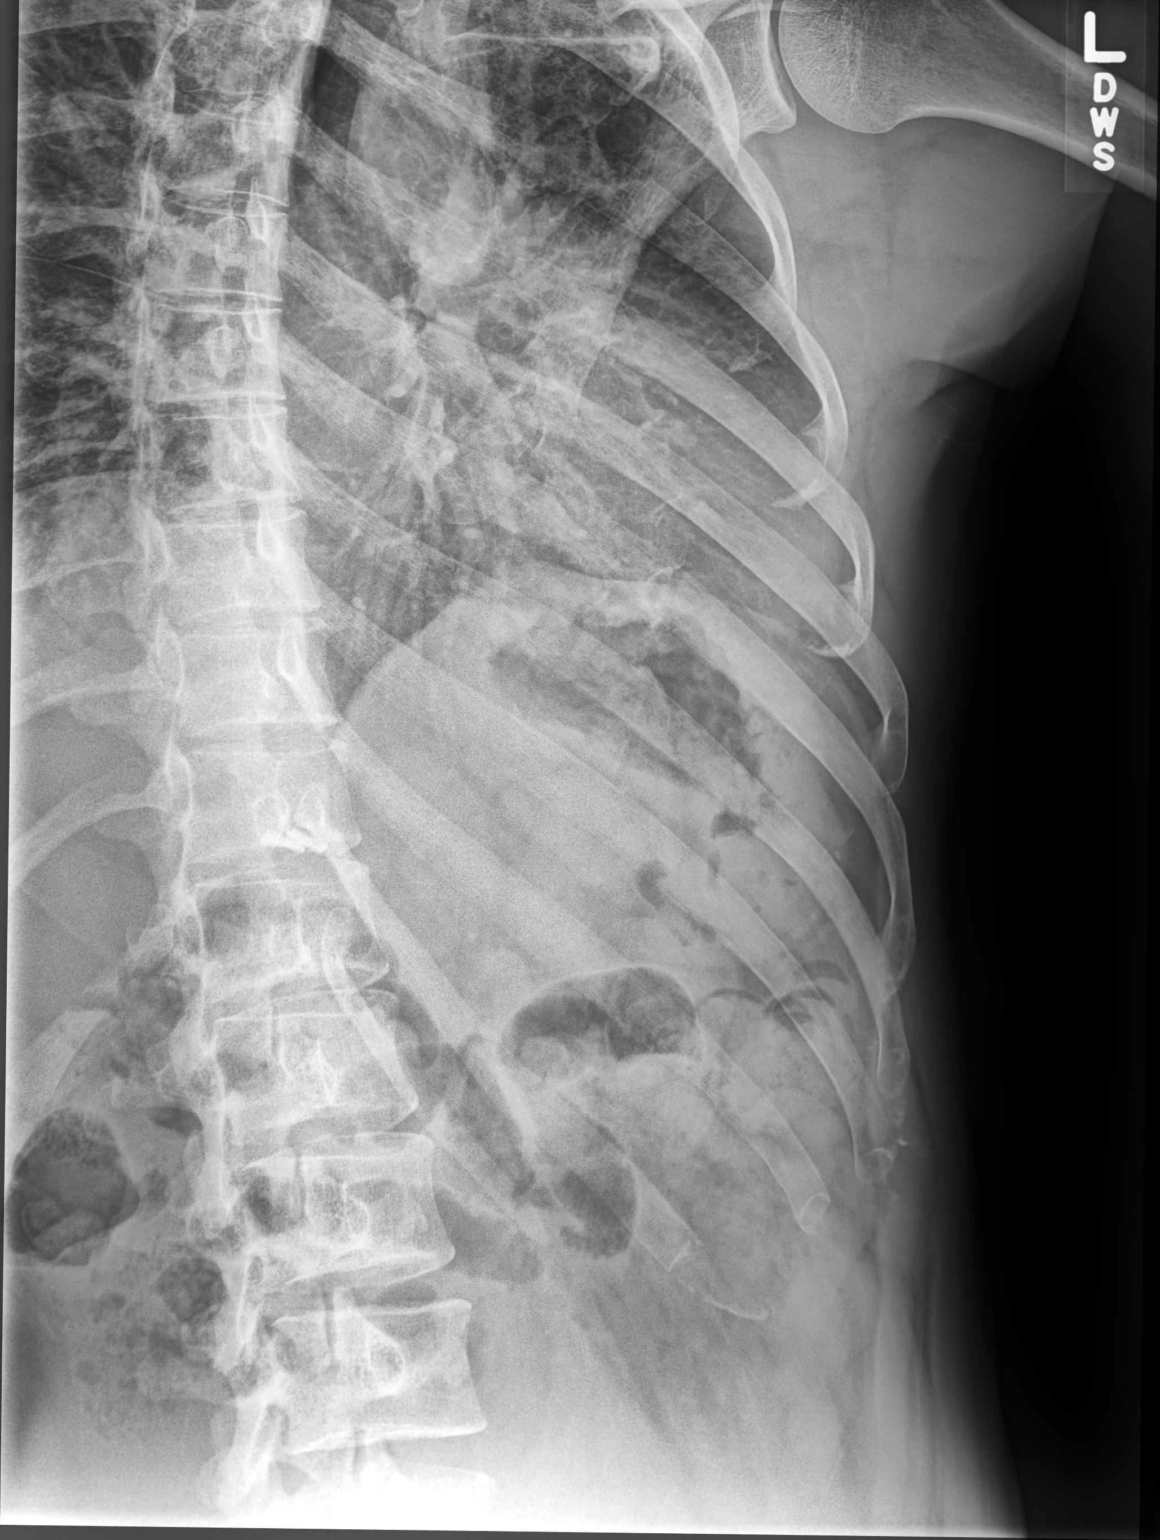

[3 of 3 positions shown; findings below may reference images not displayed]

FINDINGS: No fracture or other bone lesions are seen involving the ribs. There
is no evidence of pneumothorax or pleural effusion. Both lungs are
clear. Heart size and mediastinal contours are within normal limits.
IMPRESSION: Negative.

## 2022-03-24 ENCOUNTER — Ambulatory Visit
Admission: EM | Admit: 2022-03-24 | Discharge: 2022-03-24 | Disposition: A | Discharge status: Home / Self Care | Payer: Self-pay | Attending: Physician Assistant | Admitting: Physician Assistant

## 2022-03-24 DIAGNOSIS — R112 Nausea with vomiting, unspecified: Secondary | ICD-10-CM

## 2022-03-24 DIAGNOSIS — K5289 Other specified noninfective gastroenteritis and colitis: Secondary | ICD-10-CM

## 2022-03-24 MED ORDER — ONDANSETRON HCL 4 MG/2ML IJ SOLN
4.0000 mg | Freq: Once | INTRAMUSCULAR | Status: AC
  Administered 2022-03-24: 4 mg via INTRAMUSCULAR

## 2022-03-24 MED ORDER — ONDANSETRON HCL 4 MG PO TABS: 4.0000 mg | ORAL_TABLET | Freq: Three times a day (TID) | ORAL | 0 refills | Status: AC | PRN

## 2022-03-24 MED ORDER — FAMOTIDINE 20 MG PO TABS: 20.0000 mg | ORAL_TABLET | Freq: Two times a day (BID) | ORAL | 1 refills | Status: AC

## 2022-03-24 NOTE — Discharge Instructions (Addendum)
Advised take Zofran 4 mg every 6 hours on a regular basis to help decrease stomach cramping, nausea and vomiting. Advised take Pepcid 20 mg twice daily to help decrease the indigestion or heartburn.  Advised a clear liquid diet: Water, Sprite, ginger ale, 7-Up. Advised bland diet: Toast, crackers, grits, chicken little soup.  Avoid fried, greasy, spicy for the next couple days.  Advised follow-up PCP return to urgent care if symptoms fail to improve.

## 2022-03-24 NOTE — ED Triage Notes (Signed)
Pt present lower abdomen pain with vomiting and diarrhea. Symptoms started yesterday.

## 2022-03-24 NOTE — ED Provider Notes (Signed)
[facility]    CSN: [id number] Arrival date & time: 03/24/22  1133      History   Chief Complaint Chief Complaint  Patient presents with   Abdominal Pain    HPI Joshua Barron is a 37 y.o. male.   37 year old male presents with nausea and vomiting.  Patient indicates that yesterday morning he started having stomach upset, cramping, becoming nauseated and then started having episodes of vomiting.  Patient indicates the last time that he vomited was this morning.  He also indicated with the stomach cramping he started having some loose bowels, watery diarrhea which has been infrequent over the past 24 hours.  He also indicates having history of having indigestion and heartburn and that these symptoms has irritated the heartburn but he does not currently have any medicine to help decrease the discomfort.  He denies any fever or chills.  He indicates he has not been around any family, friends, coworkers with similar symptoms.  He indicates he did not eat anything that he was concerned about not tasting right.  He has been tolerating fluids well.   Abdominal Pain Associated symptoms: fatigue, nausea and vomiting     Past Medical History:  Diagnosis Date   Abdominal pain    Peptic ulcer disease     Patient Active Problem List   Diagnosis Date Noted   S/P laparoscopic cholecystectomy June 2014 08/20/2012    Past Surgical History:  Procedure Laterality Date   CHOLECYSTECTOMY N/A 08/20/2012   Procedure: LAPAROSCOPIC CHOLECYSTECTOMY WITH INTRAOPERATIVE CHOLANGIOGRAM;  Surgeon: Valarie Merino, MD;  Location: [facility];  Service: General;  Laterality: N/A;   LACERATION REPAIR Left 01/19/2014   Procedure: REPAIR  LACERATION LEFT THUMB;  Surgeon: Knute Neu, MD;  Location: [facility];  Service: Plastics;  Laterality: Left;   OPEN REDUCTION INTERNAL FIXATION (ORIF) DISTAL PHALANX Left 01/19/2014   Procedure: OPEN REDUCTION INTERNAL FIXATION (ORIF) DISTAL PHALANX  MIDDLE  FINGER;  Surgeon: Knute Neu, MD;  Location: [facility];  Service: Plastics;  Laterality: Left;   TENDON REPAIR Left 01/19/2014   Procedure: TENDON REPAIR OF LEFT THUMB;  Surgeon: Knute Neu, MD;  Location: [facility];  Service: Plastics;  Laterality: Left;       Home Medications    Prior to Admission medications   Medication Sig Start Date End Date Taking? Authorizing Provider  famotidine (PEPCID) 20 MG tablet Take 1 tablet (20 mg total) by mouth 2 (two) times daily. For indigestion/heartburn. 03/24/22  Yes Ellsworth Lennox, PA-C  ondansetron (ZOFRAN) 4 MG tablet Take 1 tablet (4 mg total) by mouth every 8 (eight) hours as needed for nausea or vomiting. 03/24/22  Yes Ellsworth Lennox, PA-C  naproxen (NAPROSYN) 500 MG tablet Take 1 tablet (500 mg total) by mouth 2 (two) times daily. 05/29/20   Wieters, Hallie C, PA-C  tiZANidine (ZANAFLEX) 2 MG tablet Take 1-2 tablets (2-4 mg total) by mouth every 6 (six) hours as needed for muscle spasms. 05/29/20   Wieters, Hallie C, PA-C  promethazine (PHENERGAN) 25 MG tablet Take 1 tablet (25 mg total) by mouth every 6 (six) hours as needed for nausea or vomiting. 07/29/15 05/29/20  Janne Napoleon, NP    Family History Family History  Problem Relation Age of Onset   Diabetes Mother    Cancer Mother     Social History Social History   Tobacco Use   Smoking status: Former   Smokeless tobacco: Never  Building services engineer Use: Never used  Substance Use Topics   Alcohol use: Yes    Comment: occasional   Drug use: No     Allergies   Patient has no known allergies.   Review of Systems Review of Systems  Constitutional:  Positive for fatigue.  Gastrointestinal:  Positive for abdominal pain, nausea and vomiting.     Physical Exam Triage Vital Signs ED Triage Vitals  Enc Vitals Group     BP 03/24/22 1152 124/82     Pulse Rate 03/24/22 1152 100     Resp 03/24/22 1152 18     Temp 03/24/22 1152 98.4 F (36.9 C)     Temp Source 03/24/22 1152 Oral      SpO2 03/24/22 1152 96 %     Weight --      Height --      Head Circumference --      Peak Flow --      Pain Score 03/24/22 1151 8     Pain Loc --      Pain Edu? --      Excl. in [place]? --    No data found.  Updated Vital Signs BP 124/82 (BP Location: Left Arm)   Pulse 100   Temp 98.4 F (36.9 C) (Oral)   Resp 18   SpO2 96%   Visual Acuity Right Eye Distance:   Left Eye Distance:   Bilateral Distance:    Right Eye Near:   Left Eye Near:    Bilateral Near:     Physical Exam Constitutional:      Appearance: He is well-developed.  Cardiovascular:     Rate and Rhythm: Normal rate and regular rhythm.     Heart sounds: Normal heart sounds.  Pulmonary:     Effort: Pulmonary effort is normal.     Breath sounds: Normal breath sounds and air entry. No wheezing, rhonchi or rales.  Abdominal:     General: Abdomen is flat. Bowel sounds are increased.     Palpations: Abdomen is soft.     Tenderness: There is generalized abdominal tenderness. There is no guarding or rebound.  Neurological:     Mental Status: He is alert.      UC Treatments / Results  Labs (all labs ordered are listed, but only abnormal results are displayed) Labs Reviewed - No data to display   EKG   Radiology No results found.  Procedures Procedures (including critical care time)  Medications Ordered in UC Medications  ondansetron (ZOFRAN) injection 4 mg (4 mg Intramuscular Given 03/24/22 1216)    Initial Impression / Assessment and Plan / UC Course  I have reviewed the triage vital signs and the nursing notes.  Pertinent labs & imaging results that were available during my care of the patient were reviewed by me and considered in my medical decision making (see chart for details).    Plan: 1.  The nausea and vomiting will be treated with the following: A.  Zofran 4 mg IM given in the office today. B.  Zofran 4 mg every 6 hours on a regular basis to control nausea and vomiting. 2.  The  gastroenteritis will be treated with the following: A.  Zofran 4 mg every 6 hours on a regular basis to control nausea and vomiting. B.  Pepcid 20 mg twice daily to help control indigestion, heartburn, reflux. 3.  Patient advised to follow-up liquid diet along with bland diet over the next 48 hours. 4.  Patient advised follow-up with PCP or return  to urgent care if symptoms fail to improve. Final Clinical Impressions(s) / UC Diagnoses   Final diagnoses:  Nausea and vomiting, unspecified vomiting type  Other noninfectious gastroenteritis     Discharge Instructions      Advised take Zofran 4 mg every 6 hours on a regular basis to help decrease stomach cramping, nausea and vomiting. Advised take Pepcid 20 mg twice daily to help decrease the indigestion or heartburn.  Advised a clear liquid diet: Water, Sprite, ginger ale, 7-Up. Advised bland diet: Toast, crackers, grits, chicken little soup.  Avoid fried, greasy, spicy for the next couple days.  Advised follow-up PCP return to urgent care if symptoms fail to improve.    ED Prescriptions     Medication Sig Dispense Auth. Provider   ondansetron (ZOFRAN) 4 MG tablet Take 1 tablet (4 mg total) by mouth every 8 (eight) hours as needed for nausea or vomiting. 20 tablet Nyoka Lint, PA-C   famotidine (PEPCID) 20 MG tablet Take 1 tablet (20 mg total) by mouth 2 (two) times daily. For indigestion/heartburn. 30 tablet Nyoka Lint, PA-C      PDMP not reviewed this encounter.   Nyoka Lint, PA-C 03/24/22 1219

## 2022-10-04 DIAGNOSIS — N179 Acute kidney failure, unspecified: Secondary | ICD-10-CM | POA: Diagnosis not present

## 2022-10-04 DIAGNOSIS — E860 Dehydration: Secondary | ICD-10-CM | POA: Diagnosis not present

## 2022-10-04 DIAGNOSIS — M6282 Rhabdomyolysis: Secondary | ICD-10-CM | POA: Diagnosis not present

## 2022-10-04 DIAGNOSIS — R252 Cramp and spasm: Secondary | ICD-10-CM | POA: Diagnosis not present

## 2022-10-05 DIAGNOSIS — M6282 Rhabdomyolysis: Secondary | ICD-10-CM | POA: Diagnosis not present

## 2022-10-05 DIAGNOSIS — N179 Acute kidney failure, unspecified: Secondary | ICD-10-CM | POA: Diagnosis not present
